# Patient Record
Sex: Female | Born: 1970 | Race: White | Hispanic: No | Marital: Single | Smoking: Current every day smoker
Health system: Southern US, Community
[De-identification: ages and names within clinical notes are randomized; demographics above are authoritative.]

## PROBLEM LIST (undated history)

## (undated) DIAGNOSIS — Z862 Personal history of diseases of the blood and blood-forming organs and certain disorders involving the immune mechanism: Secondary | ICD-10-CM

## (undated) HISTORY — PX: TUBAL LIGATION: SHX77

## (undated) HISTORY — DX: Personal history of diseases of the blood and blood-forming organs and certain disorders involving the immune mechanism: Z86.2

---

## 2013-01-12 DIAGNOSIS — Z862 Personal history of diseases of the blood and blood-forming organs and certain disorders involving the immune mechanism: Secondary | ICD-10-CM

## 2013-01-12 DIAGNOSIS — G4726 Circadian rhythm sleep disorder, shift work type: Secondary | ICD-10-CM | POA: Insufficient documentation

## 2013-01-12 HISTORY — DX: Personal history of diseases of the blood and blood-forming organs and certain disorders involving the immune mechanism: Z86.2

## 2013-03-13 DIAGNOSIS — Z9851 Tubal ligation status: Secondary | ICD-10-CM

## 2013-03-13 HISTORY — DX: Tubal ligation status: Z98.51

## 2013-03-14 DIAGNOSIS — M67439 Ganglion, unspecified wrist: Secondary | ICD-10-CM | POA: Insufficient documentation

## 2019-05-25 ENCOUNTER — Other Ambulatory Visit: Payer: Self-pay

## 2019-05-25 ENCOUNTER — Ambulatory Visit: Payer: BC Managed Care – PPO | Admitting: Medical-Surgical

## 2019-05-25 ENCOUNTER — Encounter: Payer: Self-pay | Admitting: Medical-Surgical

## 2019-05-25 VITALS — BP 138/83 | HR 74 | Temp 98.2°F | Ht 64.25 in | Wt 163.5 lb

## 2019-05-25 DIAGNOSIS — Z114 Encounter for screening for human immunodeficiency virus [HIV]: Secondary | ICD-10-CM

## 2019-05-25 DIAGNOSIS — Z0000 Encounter for general adult medical examination without abnormal findings: Secondary | ICD-10-CM

## 2019-05-25 DIAGNOSIS — Z23 Encounter for immunization: Secondary | ICD-10-CM

## 2019-05-25 DIAGNOSIS — Z7689 Persons encountering health services in other specified circumstances: Secondary | ICD-10-CM

## 2019-05-25 DIAGNOSIS — Z720 Tobacco use: Secondary | ICD-10-CM | POA: Insufficient documentation

## 2019-05-25 NOTE — Assessment & Plan Note (Signed)
Checking CBC, CMP, Lipids today. Tdap given today. Will return for pap smear.

## 2019-05-25 NOTE — Assessment & Plan Note (Addendum)
No current interest in smoking cessation. Risks/benefits and options available to help quit discussed.

## 2019-05-25 NOTE — Progress Notes (Signed)
New Patient Office Visit  Subjective:  Patient ID: Kelli Tate, female    DOB: 1970-07-19  Age: 49 y.o. MRN: [id number]  CC:  Chief Complaint  Patient presents with  . Establish Care    HPI Kelli Tate presents to establish care. No PCP in at least 10 years due to no insurance. Was seen once yearly at [facility] for lab work.   Tobacco abuse- smoking 1-1.5 packs of cigarettes per day for years. Usually quits during the winter as she smokes outside but did not quit this year. Not interested in cessation at this time.  IDA- had issues with iron deficiency in her teenage years and young adult hood. Has been good since the her son was born 12 years ago. No iron supplementation. No anemia s/s.  Sleep disturbance- works 3rd shift 4 days per week. 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 Using OTC Melatonin up to 30mg  per day to help with sleep.  Due for pap- uterine didelphys. Last pap 5 years ago, normal.  Past Medical History:  Diagnosis Date  . Hx of iron deficiency anemia     Past Surgical History:  Procedure Laterality Date  . TUBAL LIGATION      Family History  Problem Relation Age of Onset  . Asthma Mother   . Thyroid disease Mother   . Leukemia Sister   . Heart disease Sister   . Thyroid disease Maternal Uncle   . Asthma Maternal Grandmother   . Thyroid disease Maternal Grandmother     Social History   Socioeconomic History  . Marital status: Single    Spouse name: Not on file  . Number of children: Not on file  . Years of education: Not on file  . Highest education level: Not on file  Occupational History  . Not on file  Tobacco Use  . Smoking status: Current Every Day Smoker  . Smokeless tobacco: Never Used  Substance and Sexual Activity  . Alcohol use: Yes    Alcohol/week: 3.0 standard drinks    Types: 3 Shots of liquor per week  . Drug use: Never  . Sexual activity: Yes    Partners: Female    Birth control/protection:  Surgical  Other Topics Concern  . Not on file  Social History Narrative  . Not on file   Social Determinants of Health   Financial Resource Strain:   . Difficulty of Paying Living Expenses: Not on file  Food Insecurity:   . Worried About in the Last Year: Not on file  . Ran Out of Food in the Last Year: Not on file  Transportation Needs:   . Lack of Transportation (Medical): Not on file  . Lack of Transportation (Non-Medical): Not on file  Physical Activity:   . Days of Exercise per Week: Not on file  . Minutes of Exercise per Session: Not on file  Stress:   . Feeling of Stress : Not on file  Social Connections:   . Frequency of Communication with Friends and Family: Not on file  . Frequency of Social Gatherings with Friends and Family: Not on file  . Attends Religious Services: Not on file  . Active Member of Clubs or Organizations: Not on file  . Attends Programme researcher, broadcasting/film/video Meetings: Not on file  . Marital Status: Not on file  Intimate Partner Violence:   . Fear of Current or Ex-Partner: Not on file  . Emotionally Abused: Not  on file  . Physically Abused: Not on file  . Sexually Abused: Not on file    ROS Review of Systems  Constitutional: Negative for chills, fatigue, fever and unexpected weight change.  HENT: Negative for congestion, sinus pressure and sore throat.   Eyes: Negative for visual disturbance.  Respiratory: Negative for cough, chest tightness and shortness of breath.   Cardiovascular: Negative for chest pain, palpitations and leg swelling.  Gastrointestinal: Negative for abdominal pain, constipation, diarrhea, nausea and vomiting.  Endocrine: Negative for cold intolerance, heat intolerance, polydipsia, polyphagia and polyuria.  Genitourinary: Negative for dysuria, frequency and urgency.  Musculoskeletal: Negative for back pain and joint swelling.  Skin: Negative for rash.  Allergic/Immunologic: Positive for environmental allergies.   Neurological: Negative for dizziness, light-headedness and headaches.  Hematological: Negative for adenopathy. Does not bruise/bleed easily.  Psychiatric/Behavioral: Positive for sleep disturbance. Negative for self-injury and suicidal ideas.    Objective:   Today's Vitals: BP 138/83   Pulse 74   Temp 98.2 F (36.8 C) (Oral)   Ht 5' 4.25" (1.632 m)   Wt 163 lb 8 oz (74.2 kg)   LMP 05/17/2019   SpO2 99%   BMI 27.85 kg/m   Physical Exam Vitals reviewed.  Constitutional:      General: She is not in acute distress.    Appearance: Normal appearance.  HENT:     Head: Normocephalic and atraumatic.  Cardiovascular:     Rate and Rhythm: Normal rate and regular rhythm.     Pulses: Normal pulses.     Heart sounds: Normal heart sounds. No murmur. No friction rub. No gallop.   Pulmonary:     Effort: Pulmonary effort is normal. No respiratory distress.     Breath sounds: Normal breath sounds. No wheezing.  Skin:    General: Skin is warm and dry.     Coloration: Skin is not pale.  Neurological:     Mental Status: She is alert and oriented to person, place, and time.  Psychiatric:        Mood and Affect: Mood normal.        Behavior: Behavior normal.        Thought Content: Thought content normal.        Judgment: Judgment normal.     Assessment & Plan:   Tobacco abuse No current interest in smoking cessation. Risks/benefits and options available to help quit discussed.  Encounter for screening for HIV HIV antibody screening today.  Preventative health care Checking CBC, CMP, Lipids today. Tdap given today. Will return for pap smear.   Follow-up: Return in about 4 weeks (around 06/22/2019) for pap smear.   Clearnce Sorrel, DNP, APRN, FNP-BC [facility]

## 2019-05-25 NOTE — Assessment & Plan Note (Signed)
HIV antibody screening today.

## 2019-05-26 LAB — COMPLETE METABOLIC PANEL WITH GFR
AG Ratio: 1.7 (calc) (ref 1.0–2.5)
ALT: 12 U/L (ref 6–29)
AST: 13 U/L (ref 10–35)
Albumin: 4.4 g/dL (ref 3.6–5.1)
Alkaline phosphatase (APISO): 62 U/L (ref 31–125)
BUN: 9 mg/dL (ref 7–25)
CO2: 30 mmol/L (ref 20–32)
Calcium: 9.7 mg/dL (ref 8.6–10.2)
Chloride: 105 mmol/L (ref 98–110)
Creat: 0.77 mg/dL (ref 0.50–1.10)
GFR, Est African American: 106 mL/min/{1.73_m2} (ref >=60)
GFR, Est Non African American: 91 mL/min/{1.73_m2} (ref >=60)
Globulin: 2.6 g/dL (calc) (ref 1.9–3.7)
Glucose, Bld: 95 mg/dL (ref 65–99)
Potassium: 4.8 mmol/L (ref 3.5–5.3)
Sodium: 140 mmol/L (ref 135–146)
Total Bilirubin: 0.3 mg/dL (ref 0.2–1.2)
Total Protein: 7 g/dL (ref 6.1–8.1)

## 2019-05-26 LAB — LIPID PANEL
Cholesterol: 207 mg/dL — ABNORMAL HIGH (ref <200)
HDL: 59 mg/dL (ref >=50)
LDL Cholesterol (Calc): 123 mg/dL (calc) — ABNORMAL HIGH
Non-HDL Cholesterol (Calc): 148 mg/dL (calc) — ABNORMAL HIGH (ref <130)
Total CHOL/HDL Ratio: 3.5 (calc) (ref <5.0)
Triglycerides: 131 mg/dL (ref <150)

## 2019-05-26 LAB — CBC
HCT: 40.7 % (ref 35.0–45.0)
Hemoglobin: 13.8 g/dL (ref 11.7–15.5)
MCH: 30.9 pg (ref 27.0–33.0)
MCHC: 33.9 g/dL (ref 32.0–36.0)
MCV: 91.3 fL (ref 80.0–100.0)
MPV: 10.5 fL (ref 7.5–12.5)
Platelets: 245 10*3/uL (ref 140–400)
RBC: 4.46 10*6/uL (ref 3.80–5.10)
RDW: 14.2 % (ref 11.0–15.0)
WBC: 5.9 10*3/uL (ref 3.8–10.8)

## 2019-05-26 LAB — HIV ANTIBODY (ROUTINE TESTING W REFLEX): HIV 1&2 Ab, 4th Generation: NONREACTIVE

## 2019-06-21 ENCOUNTER — Ambulatory Visit: Payer: BC Managed Care – PPO | Admitting: Medical-Surgical

## 2019-06-29 ENCOUNTER — Other Ambulatory Visit (HOSPITAL_COMMUNITY)
Admission: RE | Admit: 2019-06-29 | Discharge: 2019-06-29 | Disposition: A | Discharge status: Home / Self Care | Payer: BC Managed Care – PPO | Source: Ambulatory Visit | Attending: Medical-Surgical | Admitting: Medical-Surgical

## 2019-06-29 ENCOUNTER — Encounter: Payer: Self-pay | Admitting: Medical-Surgical

## 2019-06-29 ENCOUNTER — Ambulatory Visit (INDEPENDENT_AMBULATORY_CARE_PROVIDER_SITE_OTHER): Payer: BC Managed Care – PPO | Admitting: Medical-Surgical

## 2019-06-29 VITALS — BP 119/62 | HR 84 | Ht 64.25 in | Wt 168.0 lb

## 2019-06-29 DIAGNOSIS — Z124 Encounter for screening for malignant neoplasm of cervix: Secondary | ICD-10-CM

## 2019-06-29 DIAGNOSIS — Q5128 Other doubling of uterus, other specified: Secondary | ICD-10-CM | POA: Diagnosis not present

## 2019-06-29 LAB — HM PAP SMEAR: HM Pap smear: NEGATIVE

## 2019-06-29 NOTE — Progress Notes (Signed)
Subjective:    CC: pap smear  HPI: Pleasant 49 year old female presenting for pap smear. History of uterine didelphys. Last pap- 5 years ago, normal. No history of abnormal pap results.  No vaginal discharge, odor, or abnormal bleeding. Menstrual cycles regular, heavy, accompanied by cramping and passing of clots. Uses Ibuprofen and Tylenol as needed.   No concerns today.  I reviewed the past medical history, family history, social history, surgical history, and allergies today and no changes were needed.  Please see the problem list section below in epic for further details.  Past Medical History: Past Medical History:  Diagnosis Date  . Hx of iron deficiency anemia    Past Surgical History: Past Surgical History:  Procedure Laterality Date  . TUBAL LIGATION     Social History: Social History   Socioeconomic History  . Marital status: Single    Spouse name: Not on file  . Number of children: Not on file  . Years of education: Not on file  . Highest education level: Not on file  Occupational History  . Not on file  Tobacco Use  . Smoking status: Current Every Day Smoker  . Smokeless tobacco: Never Used  Substance and Sexual Activity  . Alcohol use: Yes    Alcohol/week: 3.0 standard drinks    Types: 3 Shots of liquor per week  . Drug use: Never  . Sexual activity: Yes    Partners: Female    Birth control/protection: Surgical  Other Topics Concern  . Not on file  Social History Narrative  . Not on file   Social Determinants of Health   Financial Resource Strain:   . Difficulty of Paying Living Expenses:   Food Insecurity:   . Worried About Charity fundraiser in the Last Year:   . Arboriculturist in the Last Year:   Transportation Needs:   . Film/video editor (Medical):   Marland Kitchen Lack of Transportation (Non-Medical):   Physical Activity:   . Days of Exercise per Week:   . Minutes of Exercise per Session:   Stress:   . Feeling of Stress :   Social  Connections:   . Frequency of Communication with Friends and Family:   . Frequency of Social Gatherings with Friends and Family:   . Attends Religious Services:   . Active Member of Clubs or Organizations:   . Attends Archivist Meetings:   Marland Kitchen Marital Status:    Family History: Family History  Problem Relation Age of Onset  . Asthma Mother   . Thyroid disease Mother   . Leukemia Sister   . Heart disease Sister   . Thyroid disease Maternal Uncle   . Asthma Maternal Grandmother   . Thyroid disease Maternal Grandmother    Allergies: No Known Allergies Medications: See med rec.  Review of Systems: No fevers, chills, night sweats, weight loss, chest pain, or shortness of breath.   Objective:    General: Well Developed, well nourished, and in no acute distress.  Neuro: Alert and oriented x3.  HEENT: Normocephalic, atraumatic.  Skin: Warm and dry. Small mobile, firm nodule on upper abdomen with dark spot in the center resembling a blackhead. Cardiac: Regular rate and rhythm, no murmurs rubs or gallops, no lower extremity edema.  Respiratory: Clear to auscultation bilaterally. Not using accessory muscles, speaking in full sentences. Breasts: right breast normal without mass, skin or nipple changes or axillary nodes, left breast normal without mass, skin or nipple changes or axillary  nodes, abnormal mass palpable center of breasts along the lower edge of sternum, hard firm nodule present for years per patient.  Pelvic exam: VULVA: normal appearing vulva with no masses, tenderness or lesions, VAGINA: normal appearing vagina with normal color and discharge, no lesions, small vaginal septum near cervices, CERVIX: both cervix normal appearing without discharge or lesions, UTERUS: both uteri normal size, shape, consistency and nontender, ADNEXA: normal adnexa in size, nontender and no masses, PAP: Pap smear done x 2 today.   Chaperoned by Donne Anon, MA.    Impression and  Recommendations:    1. Cervical cancer screening Pap smear completed on each cervix today with HPV co-testing. If negative, next pap due in 5 years. - Cytology - PAP - Cytology - PAP  Return in about 1 year (around 06/28/2020) for annual physical exam with labs. ___________________________________________ Thayer Ohm, DNP, APRN, FNP-BC [facility] [place]

## 2019-07-02 LAB — CYTOLOGY - PAP
Comment: NEGATIVE
Comment: NEGATIVE
Diagnosis: NEGATIVE
Diagnosis: NEGATIVE
High risk HPV: NEGATIVE
High risk HPV: NEGATIVE

## 2019-07-03 ENCOUNTER — Encounter: Payer: Self-pay | Admitting: Medical-Surgical

## 2020-04-03 ENCOUNTER — Telehealth (INDEPENDENT_AMBULATORY_CARE_PROVIDER_SITE_OTHER): Payer: BC Managed Care – PPO | Admitting: Nurse Practitioner

## 2020-04-03 ENCOUNTER — Ambulatory Visit (INDEPENDENT_AMBULATORY_CARE_PROVIDER_SITE_OTHER): Payer: BC Managed Care – PPO | Admitting: Nurse Practitioner

## 2020-04-03 ENCOUNTER — Encounter: Payer: Self-pay | Admitting: Nurse Practitioner

## 2020-04-03 DIAGNOSIS — J069 Acute upper respiratory infection, unspecified: Secondary | ICD-10-CM

## 2020-04-03 DIAGNOSIS — R059 Cough, unspecified: Secondary | ICD-10-CM

## 2020-04-03 LAB — POCT INFLUENZA A/B
Influenza A, POC: NEGATIVE
Influenza B, POC: NEGATIVE

## 2020-04-03 NOTE — Progress Notes (Signed)
I spoke to patient on phone this afternoon- no need to call.   Flu testing was negative. Recommended monitoring of symptoms and to follow-up if they worsen or fail to improve.

## 2020-04-03 NOTE — Progress Notes (Signed)
Pt came by to have flu testing done.   She will be advised of results today   I have reviewed the assessment and plan listed above and agree with the treatment provided.  Tollie Eth, DNP, AGNP-c

## 2020-04-03 NOTE — Patient Instructions (Addendum)
We have many viral illnesses going around in our community right now. Viruses do not respond to antibiotics and most people recover in about a week. I have included information below on medications that can be helpful for your symptoms.   Viruses spread through the air with talking, coughing, sneezing, and laughing. Depending on the specific virus they can be very contagious to others around you. We recommend that while you are feeling ill you stay at home, away from others, to prevent the spread of infection and to protect yourself from other infections while your immune system is down.   If you must go out or be around others, we strongly recommend that you wear a mask, wash your hands frequently, and avoid touching your face.    Someone will let you know your flu test results. If it is positive, then we will plan to start Tamiflu for you. If it is negative, I want you to monitor your symptoms and if they do not improve or get worse after about 5 days, then please let us know.    The following information is provided as a Counsellor for ADULT patients only and does NOT take into account PREGNANCY, ALLERGIES, LIVER CONDITIONS, KIDNEY CONDITIONS, GASTROINTESTINAL CONDITIONS, OR PRESCRIPTION MEDICATION INTERACTIONS. Please be sure to ask your provider if the following are safe to take with your specific medical history, conditions, or current medication regimen if you are unsure.   Adult Basic Symptom Management for Sinusitis  Congestion: Guaifenesin (Mucinex)- follow directions on packaging with a maximum dose of 2400mg  in a 24 hour period.  Pain/Fever: Ibuprofen 200mg  - 400mg  every 4-6 hours as needed (MAX 1200mg  in a 24 hour period) Pain/Fever: Tylenol 500mg  -1000mg  every 6-8 hours as needed (MAX 3000mg  in a 24 hour period)  Cough: Dextromethorphan (Delsym)- follow directions on packing with a maximum dose of 120mg  in a 24 hour period.  Nasal Stuffiness: Saline nasal spray and/or Nettie  Pot with sterile saline solution  Runny Nose: Fluticasone nasal spray (Flonase) OR Mometasone nasal spray (Nasonex) OR Triamcinolone Acetonide nasal spray (Nasacort)- follow directions on the packaging  Pain/Pressure: Warm washcloth to the face  Sore Throat: Warm salt water gargles  If you have allergies, you may also consider taking an oral antihistamine (like Zyrtec or Claritin) as these may also help with your symptoms.  **Many medications will have more than one ingredient, be sure you are reading the packaging carefully and not taking more than one dose of the same kind of medication at the same time or too close together. It is OK to use formulas that have all of the ingredients you want, but do not take them in a combined medication and as separate dose too close together. If you have any questions, the pharmacist will be happy to help you decide what is safe.    Influenza, Adult Influenza is also called "the flu." It is an infection in the lungs, nose, and throat (respiratory tract). It is caused by a virus. The flu causes symptoms that are similar to symptoms of a cold. It also causes a high fever and body aches. The flu spreads easily from person to person (is contagious). Getting a flu shot (influenza vaccination) every year is the best way to prevent the flu. What are the causes? This condition is caused by the influenza virus. You can get the virus by:  Breathing in droplets that are in the air from the cough or sneeze of a person who has the  virus.  Touching something that has the virus on it (is contaminated) and then touching your mouth, nose, or eyes. What increases the risk? Certain things may make you more likely to get the flu. These include:  Not washing your hands often.  Having close contact with many people during cold and flu season.  Touching your mouth, eyes, or nose without first washing your hands.  Not getting a flu shot every year. You may have a higher  risk for the flu, along with serious problems such as a lung infection (pneumonia), if you:  Are older than 65.  Are pregnant.  Have a weakened disease-fighting system (immune system) because of a disease or taking certain medicines.  Have a long-term (chronic) illness, such as: ? Heart, kidney, or lung disease. ? Diabetes. ? Asthma.  Have a liver disorder.  Are very overweight (morbidly obese).  Have anemia. This is a condition that affects your red blood cells. What are the signs or symptoms? Symptoms usually begin suddenly and last 4-14 days. They may include:  Fever and chills.  Headaches, body aches, or muscle aches.  Sore throat.  Cough.  Runny or stuffy (congested) nose.  Chest discomfort.  Not wanting to eat as much as normal (poor appetite).  Weakness or feeling tired (fatigue).  Dizziness.  Feeling sick to your stomach (nauseous) or throwing up (vomiting). How is this treated? If the flu is found Kelli Tate, you can be treated with medicine that can help reduce how bad the illness is and how long it lasts (antiviral medicine). This may be given by mouth (orally) or through an IV tube. Taking care of yourself at home can help your symptoms get better. Your doctor may suggest:  Taking over-the-counter medicines.  Drinking plenty of fluids. The flu often goes away on its own. If you have very bad symptoms or other problems, you may be treated in a hospital. Follow these instructions at home:     Activity  Rest as needed. Get plenty of sleep.  Stay home from work or school as told by your doctor. ? Do not leave home until you do not have a fever for 24 hours without taking medicine. ? Leave home only to visit your doctor. Eating and drinking  Take an ORS (oral rehydration solution). This is a drink that is sold at pharmacies and stores.  Drink enough fluid to keep your pee (urine) pale yellow.  Drink clear fluids in small amounts as you are able.  Clear fluids include: ? Water. ? Ice chips. ? Fruit juice that has water added (diluted fruit juice). ? Low-calorie sports drinks.  Eat bland, easy-to-digest foods in small amounts as you are able. These foods include: ? Bananas. ? Applesauce. ? Rice. ? Lean meats. ? Toast. ? Crackers.  Do not eat or drink: ? Fluids that have a lot of sugar or caffeine. ? Alcohol. ? Spicy or fatty foods. General instructions  Take over-the-counter and prescription medicines only as told by your doctor.  Use a cool mist humidifier to add moisture to the air in your home. This can make it easier for you to breathe.  Cover your mouth and nose when you cough or sneeze.  Wash your hands with soap and water often, especially after you cough or sneeze. If you cannot use soap and water, use alcohol-based hand sanitizer.  Keep all follow-up visits as told by your doctor. This is important. How is this prevented?   Get a flu shot every  year. Kelli Tate may get the flu shot in late summer, fall, or winter. Ask your doctor when you should get your flu shot.  Avoid contact with people who are sick during fall and winter (cold and flu season). Contact a doctor if:  You get new symptoms.  You have: ? Chest pain. ? Watery poop (diarrhea). ? A fever.  Your cough gets worse.  You start to have more mucus.  You feel sick to your stomach.  You throw up. Get help right away if you:  Have shortness of breath.  Have trouble breathing.  Have skin or nails that turn a bluish color.  Have very bad pain or stiffness in your neck.  Get a sudden headache.  Get sudden pain in your face or ear.  Cannot eat or drink without throwing up. Summary  Influenza ("the flu") is an infection in the lungs, nose, and throat. It is caused by a virus.  Take over-the-counter and prescription medicines only as told by your doctor.  Getting a flu shot every year is the best way to avoid getting the flu. This  information is not intended to replace advice given to you by your health care provider. Make sure you discuss any questions you have with your health care provider. Document Revised: 09/14/2017 Document Reviewed: 09/14/2017 Elsevier Patient Education  2020 [employer]   Sinusitis, Adult Sinusitis is soreness and swelling (inflammation) of your sinuses. Sinuses are hollow spaces in the bones around your face. They are located:  Around your eyes.  In the middle of your forehead.  Behind your nose.  In your cheekbones. Your sinuses and nasal passages are lined with a fluid called mucus. Mucus drains out of your sinuses. Swelling can trap mucus in your sinuses. This lets germs (bacteria, virus, or fungus) grow, which leads to infection. Most of the time, this condition is caused by a virus. What are the causes? This condition is caused by:  Allergies.  Asthma.  Germs.  Things that block your nose or sinuses.  Growths in the nose (nasal polyps).  Chemicals or irritants in the air.  Fungus (rare). What increases the risk? You are more likely to develop this condition if:  You have a weak body defense system (immune system).  You do a lot of swimming or diving.  You use nasal sprays too much.  You smoke. What are the signs or symptoms? The main symptoms of this condition are pain and a feeling of pressure around the sinuses. Other symptoms include:  Stuffy nose (congestion).  Runny nose (drainage).  Swelling and warmth in the sinuses.  Headache.  Toothache.  A cough that may get worse at night.  Mucus that collects in the throat or the back of the nose (postnasal drip).  Being unable to smell and taste.  Being very tired (fatigue).  A fever.  Sore throat.  Bad breath. How is this diagnosed? This condition is diagnosed based on:  Your symptoms.  Your medical history.  A physical exam.  Tests to find out if your condition is short-term (acute)  or long-term (chronic). Your doctor may: ? Check your nose for growths (polyps). ? Check your sinuses using a tool that has a light (endoscope). ? Check for allergies or germs. ? Do imaging tests, such as an MRI or CT scan. How is this treated? Treatment for this condition depends on the cause and whether it is short-term or long-term.  If caused by a virus, your symptoms should go  away on their own within 10 days. You may be given medicines to relieve symptoms. They include: ? Medicines that shrink swollen tissue in the nose. ? Medicines that treat allergies (antihistamines). ? A spray that treats swelling of the nostrils. ? Rinses that help get rid of thick mucus in your nose (nasal saline washes).  If caused by bacteria, your doctor may wait to see if you will get better without treatment. You may be given antibiotic medicine if you have: ? A very bad infection. ? A weak body defense system.  If caused by growths in the nose, you may need to have surgery. Follow these instructions at home: Medicines  Take, use, or apply over-the-counter and prescription medicines only as told by your doctor. These may include nasal sprays.  If you were prescribed an antibiotic medicine, take it as told by your doctor. Do not stop taking the antibiotic even if you start to feel better. Hydrate and humidify   Drink enough water to keep your pee (urine) pale yellow.  Use a cool mist humidifier to keep the humidity level in your home above 50%.  Breathe in steam for 10-15 minutes, 3-4 times a day, or as told by your doctor. You can do this in the bathroom while a hot shower is running.  Try not to spend time in cool or dry air. Rest  Rest as much as you can.  Sleep with your head raised (elevated).  Make sure you get enough sleep each night. General instructions   Put a warm, moist washcloth on your face 3-4 times a day, or as often as told by your doctor. This will help with  discomfort.  Wash your hands often with soap and water. If there is no soap and water, use hand sanitizer.  Do not smoke. Avoid being around people who are smoking (secondhand smoke).  Keep all follow-up visits as told by your doctor. This is important. Contact a doctor if:  You have a fever.  Your symptoms get worse.  Your symptoms do not get better within 10 days. Get help right away if:  You have a very bad headache.  You cannot stop throwing up (vomiting).  You have very bad pain or swelling around your face or eyes.  You have trouble seeing.  You feel confused.  Your neck is stiff.  You have trouble breathing. Summary  Sinusitis is swelling of your sinuses. Sinuses are hollow spaces in the bones around your face.  This condition is caused by tissues in your nose that become inflamed or swollen. This traps germs. These can lead to infection.  If you were prescribed an antibiotic medicine, take it as told by your doctor. Do not stop taking it even if you start to feel better.  Keep all follow-up visits as told by your doctor. This is important. This information is not intended to replace advice given to you by your health care provider. Make sure you discuss any questions you have with your health care provider. Document Revised: 08/29/2017 Document Reviewed: 08/29/2017 Elsevier Patient Education  2020 [employer].

## 2020-04-03 NOTE — Progress Notes (Signed)
Virtual Video Visit via MyChart Note  I connected with  Kelli Tate on 04/03/20 at 10:50 AM EST by the video enabled telemedicine application for , MyChart, and verified that I am speaking with the correct person using two identifiers.   I introduced myself as a Publishing rights manager with the practice. We discussed the limitations of evaluation and management by telemedicine and the availability of in person appointments. The patient expressed understanding and agreed to proceed.  Participating parties in this visit include: the patient and the nurse practitioner listed The patient is: at home I am: in the office  Subjective:    CC:  Chief Complaint  Patient presents with  . URI   HPI: Kelli Tate is a 49 y.o. y/o female presenting via MyChart today for upper respiratory symptoms. She has not had a flu vaccine this year. She has had her COVID vaccine.    Woke up yesterday with a sore throat, suspected fever last night, chills, generalized body aches, sinus pain and pressure, yellow mucous production, cough, and headache.  She denies loss of taste, loss of smell, shortness of breath, nausea, vomiting, or diarrhea.   She did take a home COVID test after her symptoms started and that was negative. She was around her aunt last week who had a sinus infection.   She has been taking alka seltzer plus cold and humidifier  Past medical history, Surgical history, Family history not pertinant except as noted below, Social history, Allergies, and medications have been entered into the medical record, reviewed, and corrections made.   Review of Systems:  All review of systems negative except what is listed in the HPI   Objective:    General: Speaking clearly in complete sentences without any shortness of breath.   Mild audible congestion present.  Alert and oriented x3.   Normal judgment.  No apparent acute distress.  Impression and Recommendations:    1. Viral URI with cough Symptoms  and presentation consistent with viral upper respiratory infection with sinus involvement.  Discussed with patient that antibiotic treatment is not effective for viral infections.  Recommendations for OTC treatment options provided including rest, hydration, and avoidance of other people.  Given that home test for COVID was negative after symptom onset, this is unlikely at this time. I do feel she would benefit from a flu test and if positive we can start Tamiflu.  Discussed ongoing monitoring of symptoms and recommendation for follow-up if her symptoms improve and then worsen again or if she continues to have symptoms after 5 days of illness- at that time, antibiotics would be considered for suspected bacterial involvement.     I discussed the assessment and treatment plan with the patient. The patient was provided an opportunity to ask questions and all were answered. The patient agreed with the plan and demonstrated an understanding of the instructions.   The patient was advised to call back or seek an in-person evaluation if the symptoms worsen or if the condition fails to improve as anticipated.  I provided 20 minutes of non-face-to-face interaction with this MYCHART visit including intake, same-day documentation, and chart review.   Tollie Eth, NP

## 2020-04-03 NOTE — Addendum Note (Signed)
Addended by: Deno Etienne on: 04/03/2020 02:39 PM   Modules accepted: Orders

## 2020-06-12 ENCOUNTER — Other Ambulatory Visit: Payer: Self-pay | Admitting: Family Medicine

## 2020-06-12 DIAGNOSIS — M545 Low back pain, unspecified: Secondary | ICD-10-CM

## 2020-07-09 ENCOUNTER — Other Ambulatory Visit: Payer: Self-pay

## 2020-07-09 ENCOUNTER — Ambulatory Visit
Admission: RE | Admit: 2020-07-09 | Discharge: 2020-07-09 | Disposition: A | Discharge status: Home / Self Care | Payer: BC Managed Care – PPO | Source: Ambulatory Visit | Attending: Family Medicine | Admitting: Family Medicine

## 2020-07-09 DIAGNOSIS — M545 Low back pain, unspecified: Secondary | ICD-10-CM

## 2020-09-10 ENCOUNTER — Ambulatory Visit: Payer: BC Managed Care – PPO | Admitting: Medical-Surgical

## 2020-09-10 ENCOUNTER — Encounter: Payer: Self-pay | Admitting: Medical-Surgical

## 2020-09-10 ENCOUNTER — Other Ambulatory Visit: Payer: Self-pay

## 2020-09-10 ENCOUNTER — Ambulatory Visit (INDEPENDENT_AMBULATORY_CARE_PROVIDER_SITE_OTHER): Payer: BC Managed Care – PPO | Admitting: Sports Medicine

## 2020-09-10 VITALS — BP 129/82 | HR 76 | Temp 98.1°F | Resp 20 | Ht 64.25 in | Wt 175.5 lb

## 2020-09-10 DIAGNOSIS — Z720 Tobacco use: Secondary | ICD-10-CM

## 2020-09-10 DIAGNOSIS — G8929 Other chronic pain: Secondary | ICD-10-CM

## 2020-09-10 DIAGNOSIS — M545 Low back pain, unspecified: Secondary | ICD-10-CM | POA: Diagnosis not present

## 2020-09-10 DIAGNOSIS — M48061 Spinal stenosis, lumbar region without neurogenic claudication: Secondary | ICD-10-CM | POA: Diagnosis not present

## 2020-09-10 DIAGNOSIS — R944 Abnormal results of kidney function studies: Secondary | ICD-10-CM | POA: Diagnosis not present

## 2020-09-10 MED ORDER — TIZANIDINE HCL 6 MG PO CAPS: 6.0000 mg | ORAL_CAPSULE | Freq: Three times a day (TID) | ORAL | 3 refills | Status: DC | PRN

## 2020-09-10 MED ORDER — BUPROPION HCL ER (SR) 150 MG PO TB12
150.0000 mg | ORAL_TABLET | Freq: Two times a day (BID) | ORAL | 1 refills | Status: DC
Start: 2020-09-10 — End: 2021-03-12

## 2020-09-10 NOTE — Progress Notes (Signed)
Subjective:    CC: discuss concerns  HPI: Pleasant 50 year old female presenting today to discuss some recent concerns.  She has been seeking care at [facility] for low back pain.  They are prescribing several medications to help manage her pain but she feels like their treatment is more of a Band-Aid rather than a fix.  She would prefer not to be on medications long-term bothersome.  She does have an appointment with Dr. Karie Schwalbe to discuss potential options for treatment today.  Her biggest concern today is that [facility] had lab work drawn and diagnosed her with stage III CKD.  As of her last check March 2021, her GFR was 91.  On her results from a few months ago, her GFR had reduced to 57.  She is not using regular NSAIDs and has not had any issues with dehydration.  She is very concerned that her kidney function is reduced so much and would like to have it rechecked today.  She said no changes in urinary function.  Currently taking Wellbutrin 150 mg twice daily to help with smoking cessation.  Notes that she was smoking around 2 packs/day but she has been able to reduce this to a little less than 1 pack/day.  She has tried patches in the past and been able to quit successfully but with her work, she frequently sweats the patch of.  She has tried holding in place with Band-Aids but this was unsuccessful.  I reviewed the past medical history, family history, social history, surgical history, and allergies today and no changes were needed.  Please see the problem list section below in epic for further details.  Past Medical History: Past Medical History:  Diagnosis Date  . Hx of iron deficiency anemia    Past Surgical History: Past Surgical History:  Procedure Laterality Date  . TUBAL LIGATION     Social History: Social History   Socioeconomic History  . Marital status: Single    Spouse name: Not on file  . Number of children: Not on file  . Years of education: Not on file  .  Highest education level: Not on file  Occupational History  . Not on file  Tobacco Use  . Smoking status: Current Every Day Smoker  . Smokeless tobacco: Never Used  Substance and Sexual Activity  . Alcohol use: Yes    Alcohol/week: 3.0 standard drinks    Types: 3 Shots of liquor per week  . Drug use: Never  . Sexual activity: Yes    Partners: Female    Birth control/protection: Surgical  Other Topics Concern  . Not on file  Social History Narrative  . Not on file   Social Determinants of Health   Financial Resource Strain: Not on file  Food Insecurity: Not on file  Transportation Needs: Not on file  Physical Activity: Not on file  Stress: Not on file  Social Connections: Not on file   Family History: Family History  Problem Relation Age of Onset  . Asthma Mother   . Thyroid disease Mother   . Leukemia Sister   . Heart disease Sister   . Thyroid disease Maternal Uncle   . Asthma Maternal Grandmother   . Thyroid disease Maternal Grandmother    Allergies: No Known Allergies Medications: See med rec.  Review of Systems: See HPI for pertinent positives and negatives.   Objective:    General: Well Developed, well nourished, and in no acute distress.  Neuro: Alert and oriented x3.  HEENT: Normocephalic, atraumatic.  Skin: Warm and dry. Cardiac: Regular rate and rhythm, no murmurs rubs or gallops, no lower extremity edema.  Respiratory: Clear to auscultation bilaterally. Not using accessory muscles, speaking in full sentences.  Impression and Recommendations:    1. Decreased GFR Discussed renal function and indicators of CKD stage III.  We will recheck her CMP today to see if this was a transient reduction.  Reviewed medications to avoid as well as hydration recommendations. - COMPLETE METABOLIC PANEL WITH GFR  2. Tobacco abuse Continue Wellbutrin 150 mg twice daily.  If unable to keep the patch in place with Band-Aids, consider Tegaderm. - buPROPion (WELLBUTRIN  SR) 150 MG 12 hr tablet; Take 1 tablet (150 mg total) by mouth 2 (two) times daily.  Dispense: 180 tablet; Refill: 1  3. Chronic bilateral low back pain without sciatica Continue with plan to see Dr. Karie Schwalbe.  Return in about 6 months (around 03/12/2021) for chronic condition follow up. ___________________________________________ Thayer Ohm, DNP, APRN, FNP-BC Primary Care and Sports Medicine [facility] [place]

## 2020-09-10 NOTE — Progress Notes (Signed)
    Procedures performed today:    None.  Independent interpretation of notes and tests performed by another provider:   MRI personally reviewed, dominant finding is mild spondylolisthesis and central canal stenosis at L4-L5.  Brief History, Exam, Impression, and Recommendations:    Lumbar spinal stenosis This is a pleasant 50 year old female, she has had 6 months of low back pain, axial with occasional radiation around the right anterior thigh. She has done formal physical therapy without much improvement, she has had medications including tramadol and tizanidine, tramadol not effective, tizanidine does appear to give some relief, I am going to increase her dose. She cannot use NSAIDs due to chronic renal insufficiency. Considering failure of the above conservative treatment we will proceed with a right L4-L5 interlaminar epidural. Return to see me 1 month after the injection.    ___________________________________________ Ihor Austin. Benjamin Stain, M.D., ABFM., CAQSM. Primary Care and Sports Medicine [facility]  Adjunct Instructor of Family Medicine  [employer]

## 2020-09-10 NOTE — Assessment & Plan Note (Signed)
This is a pleasant 50 year old female, she has had 6 months of low back pain, axial with occasional radiation around the right anterior thigh. She has done formal physical therapy without much improvement, she has had medications including tramadol and tizanidine, tramadol not effective, tizanidine does appear to give some relief, I am going to increase her dose. She cannot use NSAIDs due to chronic renal insufficiency. Considering failure of the above conservative treatment we will proceed with a right L4-L5 interlaminar epidural. Return to see me 1 month after the injection.

## 2020-09-11 LAB — COMPLETE METABOLIC PANEL WITHOUT GFR
AG Ratio: 2.2 (calc) (ref 1.0–2.5)
ALT: 10 U/L (ref 6–29)
AST: 14 U/L (ref 10–35)
Albumin: 4.6 g/dL (ref 3.6–5.1)
Alkaline phosphatase (APISO): 58 U/L (ref 31–125)
BUN: 8 mg/dL (ref 7–25)
CO2: 26 mmol/L (ref 20–32)
Calcium: 10.1 mg/dL (ref 8.6–10.2)
Chloride: 104 mmol/L (ref 98–110)
Creat: 0.89 mg/dL (ref 0.50–1.10)
GFR, Est African American: 88 mL/min/{1.73_m2}
GFR, Est Non African American: 76 mL/min/{1.73_m2}
Globulin: 2.1 g/dL (ref 1.9–3.7)
Glucose, Bld: 120 mg/dL — ABNORMAL HIGH (ref 65–99)
Potassium: 4.2 mmol/L (ref 3.5–5.3)
Sodium: 139 mmol/L (ref 135–146)
Total Bilirubin: 0.3 mg/dL (ref 0.2–1.2)
Total Protein: 6.7 g/dL (ref 6.1–8.1)

## 2020-09-17 ENCOUNTER — Ambulatory Visit
Admission: RE | Admit: 2020-09-17 | Discharge: 2020-09-17 | Disposition: A | Discharge status: Home / Self Care | Payer: BC Managed Care – PPO | Source: Ambulatory Visit | Attending: Sports Medicine | Admitting: Sports Medicine

## 2020-09-17 DIAGNOSIS — M48061 Spinal stenosis, lumbar region without neurogenic claudication: Secondary | ICD-10-CM

## 2020-09-17 MED ORDER — IOPAMIDOL (ISOVUE-M 200) INJECTION 41%
1.0000 mL | Freq: Once | INTRAMUSCULAR | Status: AC
  Administered 2020-09-17: 1 mL via EPIDURAL

## 2020-09-17 MED ORDER — METHYLPREDNISOLONE ACETATE 40 MG/ML INJ SUSP (RADIOLOG
80.0000 mg | Freq: Once | INTRAMUSCULAR | Status: AC
  Administered 2020-09-17: 80 mg via EPIDURAL

## 2020-09-17 NOTE — Discharge Instructions (Signed)
Post Procedure Spinal Discharge Instruction Sheet  1. You may resume a regular diet and any medications that you routinely take (including pain medications).  2. No driving day of procedure.  3. Light activity throughout the rest of the day.  Do not do any strenuous work, exercise, bending or lifting.  The day following the procedure, you can resume normal physical activity but you should refrain from exercising or physical therapy for at least three days thereafter.   Common Side Effects:   Headaches- take your usual medications as directed by your physician.  Increase your fluid intake.  Caffeinated beverages may be helpful.  Lie flat in bed until your headache resolves.   Restlessness or inability to sleep- you may have trouble sleeping for the next few days.  Ask your referring physician if you need any medication for sleep.   Facial flushing or redness- should subside within a few days.   Increased pain- a temporary increase in pain a day or two following your procedure is not unusual.  Take your pain medication as prescribed by your referring physician.   Leg cramps  Please contact our office at [phone] for the following symptoms:  Fever greater than 100 degrees.  Headaches unresolved with medication after 2-3 days.  Increased swelling, pain, or redness at injection site.   Thank you for visiting [facility] today.  

## 2021-01-29 ENCOUNTER — Ambulatory Visit: Payer: BC Managed Care – PPO | Admitting: Sports Medicine

## 2021-01-29 ENCOUNTER — Ambulatory Visit (INDEPENDENT_AMBULATORY_CARE_PROVIDER_SITE_OTHER): Payer: BC Managed Care – PPO

## 2021-01-29 ENCOUNTER — Other Ambulatory Visit: Payer: Self-pay

## 2021-01-29 DIAGNOSIS — M25552 Pain in left hip: Secondary | ICD-10-CM | POA: Diagnosis not present

## 2021-01-29 DIAGNOSIS — M48061 Spinal stenosis, lumbar region without neurogenic claudication: Secondary | ICD-10-CM

## 2021-01-29 NOTE — Assessment & Plan Note (Signed)
Kelli Tate returns, she is a pleasant 49 year old female, she has several months of low back pain, we ultimately diagnosed lumbar spinal stenosis and treated her with a right L4-L5 interlaminar epidural that provided complete relief. She is unable to use NSAIDs due to chronic renal insufficiency. Historically tramadol, tizanidine were not effective. Considering her complete relief, no further intervention or evaluation of her low back as needed.

## 2021-01-29 NOTE — Assessment & Plan Note (Addendum)
She also had an episode where she had some difficulty lifting up her left thigh, there was some moderate pain, her friend manipulated her leg and there was a pop and she was able to move it again. No injuries. She shows the C sign when describing her discomfort. On exam she has a positive FADIR sign consistent with a hip labral tear. We will start conservatively, left hip x-rays, hip conditioning exercises. Return to see me in 6 weeks as needed for MR arthrography.

## 2021-01-29 NOTE — Progress Notes (Signed)
    Procedures performed today:    None.  Independent interpretation of notes and tests performed by another provider:   None.  Brief History, Exam, Impression, and Recommendations:    Lumbar spinal stenosis Kelli Tate returns, she is a pleasant 50 year old female, she has several months of low back pain, we ultimately diagnosed lumbar spinal stenosis and treated her with a right L4-L5 interlaminar epidural that provided complete relief. She is unable to use NSAIDs due to chronic renal insufficiency. Historically tramadol, tizanidine were not effective. Considering her complete relief, no further intervention or evaluation of her low back as needed.  Left hip pain She also had an episode where she had some difficulty lifting up her left thigh, there was some moderate pain, her friend manipulated her leg and there was a pop and she was able to move it again. No injuries. She shows the C sign when describing her discomfort. On exam she has a positive FADIR sign consistent with a hip labral tear. We will start conservatively, left hip x-rays, hip conditioning exercises. Return to see me in 6 weeks as needed for MR arthrography.    ___________________________________________ Ihor Austin. Benjamin Stain, M.D., ABFM., CAQSM. Primary Care and Sports Medicine [facility]  Adjunct Instructor of Family Medicine  [employer]

## 2021-02-25 ENCOUNTER — Other Ambulatory Visit: Payer: Self-pay | Admitting: Sports Medicine

## 2021-02-25 DIAGNOSIS — M48061 Spinal stenosis, lumbar region without neurogenic claudication: Secondary | ICD-10-CM

## 2021-03-12 ENCOUNTER — Other Ambulatory Visit: Payer: Self-pay

## 2021-03-12 ENCOUNTER — Ambulatory Visit: Payer: BC Managed Care – PPO | Admitting: Medical-Surgical

## 2021-03-12 ENCOUNTER — Encounter: Payer: Self-pay | Admitting: Medical-Surgical

## 2021-03-12 ENCOUNTER — Ambulatory Visit: Payer: BC Managed Care – PPO | Admitting: Sports Medicine

## 2021-03-12 VITALS — BP 133/76 | HR 65 | Resp 20 | Ht 64.25 in | Wt 172.0 lb

## 2021-03-12 DIAGNOSIS — Z1231 Encounter for screening mammogram for malignant neoplasm of breast: Secondary | ICD-10-CM

## 2021-03-12 DIAGNOSIS — M48061 Spinal stenosis, lumbar region without neurogenic claudication: Secondary | ICD-10-CM

## 2021-03-12 DIAGNOSIS — Z720 Tobacco use: Secondary | ICD-10-CM | POA: Diagnosis not present

## 2021-03-12 DIAGNOSIS — Z23 Encounter for immunization: Secondary | ICD-10-CM

## 2021-03-12 DIAGNOSIS — F4329 Adjustment disorder with other symptoms: Secondary | ICD-10-CM

## 2021-03-12 DIAGNOSIS — M25552 Pain in left hip: Secondary | ICD-10-CM | POA: Diagnosis not present

## 2021-03-12 MED ORDER — BUPROPION HCL ER (SR) 150 MG PO TB12
150.0000 mg | ORAL_TABLET | Freq: Two times a day (BID) | ORAL | 3 refills | Status: DC
Start: 2021-03-12 — End: 2023-08-17

## 2021-03-12 MED ORDER — TIZANIDINE HCL 6 MG PO CAPS: 6.0000 mg | ORAL_CAPSULE | Freq: Two times a day (BID) | ORAL | 3 refills | Status: DC | PRN

## 2021-03-12 MED ORDER — ESCITALOPRAM OXALATE 5 MG PO TABS: 5.0000 mg | ORAL_TABLET | Freq: Every day | ORAL | 3 refills | Status: DC

## 2021-03-12 MED ORDER — TRAMADOL HCL 50 MG PO TABS: 50.0000 mg | ORAL_TABLET | Freq: Three times a day (TID) | ORAL | 0 refills | Status: DC | PRN

## 2021-03-12 NOTE — Patient Instructions (Signed)
Reasons why it is important to allow yourself to process and experience true feelings: When you numb sadness, you also numb happiness and Kelli Tate. Struggling with your emotions often leads to more suffering. Processing and experiencing your feelings is a part of having a full life.    Coping skills are things we can do to make ourselves feel better when we are going through difficult times. Examples of coping skills include:  Take a deep breath Count to 20 Listen to music Call a friend Take a walk Read a book Do a puzzle Meditate Journal Exercise Stretch Sing Bake Knit Go outside Garden Pray Color Send a note Take a bath Watch a movie Pet an animal Visit a friend Be alone in a quiet place   When your anxiety gets worse, try these grounding techniques:      If you need additional help, please contact your primary care provider or call one of the resources listed below:  [facility]  24-hour HelpLine at 336-832-9700 or 800-711-2635 [address] [place], [place] [address]  National Hopeline Network 800-SUICIDE or 800-784-2433  The National Suicide Prevention Lifeline 800-273-TALK or 800-273-8255  [facility] https://www.celebraterecovery.com/   

## 2021-03-12 NOTE — Progress Notes (Signed)
    Procedures performed today:    None.  Independent interpretation of notes and tests performed by another provider:   None.  Brief History, Exam, Impression, and Recommendations:    Left hip pain This is a very pleasant 50 year old female, she had an episode where she had some difficulty lifting up her thigh, moderate pain, she felt a pop and was able to move it again, she did show the C-spine at the last visit when describing her discomfort, she did also have a positive FADIR sign concerning for hip labral tear, she has improved considerably over the last 6 weeks. Ibuprofen is not cutting it so we will add some tramadol, continue home conditioning exercises and we will hold off on aggressive interventions for now, though if she does not have sufficient improvement over the next 6 to 8 weeks we will proceed with MR arthrography.    ___________________________________________ Ihor Austin. Benjamin Stain, M.D., ABFM., CAQSM. Primary Care and Sports Medicine  [facility]  Adjunct Instructor of Family Medicine  [employer]

## 2021-03-12 NOTE — Assessment & Plan Note (Signed)
This is a very pleasant 50 year old female, she had an episode where she had some difficulty lifting up her thigh, moderate pain, she felt a pop and was able to move it again, she did show the C-spine at the last visit when describing her discomfort, she did also have a positive FADIR sign concerning for hip labral tear, she has improved considerably over the last 6 weeks. Ibuprofen is not cutting it so we will add some tramadol, continue home conditioning exercises and we will hold off on aggressive interventions for now, though if she does not have sufficient improvement over the next 6 to 8 weeks we will proceed with MR arthrography.

## 2021-03-12 NOTE — Progress Notes (Signed)
  HPI with pertinent ROS:   CC: 43-month follow-up  HPI: Pleasant 50 year old female presenting today for follow-up. She has been doing fairly well overall since our last visit.  She is using the Wellbutrin as prescribed to help with smoking cessation but has not been able to quit just yet.  She was previously almost up to 2 packs/day but has been able to reduce this to around 1 pack/day or less.  Has noted that the Wellbutrin helps with stress and mood a little but she has been dealing with several issues lately that have worsened her stress level.  Has not taken any other medications for mood but her significant other is taking Lexapro.  She wonders if this might be helpful.  She is not currently doing any counseling but she does have that option through work and is considering reaching out to get started with that.  Denies SI/HI.  I reviewed the past medical history, family history, social history, surgical history, and allergies today and no changes were needed.  Please see the problem list section below in epic for further details.   Physical exam:   General: Well Developed, well nourished, and in no acute distress.  Neuro: Alert and oriented x3.  HEENT: Normocephalic, atraumatic.  Skin: Warm and dry. Cardiac: Regular rate and rhythm, no murmurs rubs or gallops, no lower extremity edema.  Respiratory: Clear to auscultation bilaterally. Not using accessory muscles, speaking in full sentences.  Impression and Recommendations:    1. Tobacco abuse Continue Wellbutrin 150 mg twice daily.  We will hold off on increasing this at this point since she does have quite a bit of stress and anxiety right now.  Recommend continuing smoking cessation efforts. - buPROPion (WELLBUTRIN SR) 150 MG 12 hr tablet; Take 1 tablet (150 mg total) by mouth 2 (two) times daily.  Dispense: 180 tablet; Refill: 3  2. Stress and adjustment reaction Discussed various options for treatment.  Strongly recommend starting  counseling for both her and her son since he has a big part of her stress.  Starting Lexapro 5 mg daily.  3. Need for influenza vaccination Declined flu vaccine  4. Need for shingles vaccine Shingrix No. 1 given in office today.  Next will be due in 2 to 6 months. - Varicella-zoster vaccine IM (Shingrix)  5. Encounter for screening mammogram for malignant neoplasm of breast Mammogram ordered. - MM 3D SCREEN BREAST BILATERAL; Future  Return in about 4 weeks (around 04/09/2021) for mood follow up (ok to be virtual if needed). ___________________________________________ Thayer Ohm, DNP, APRN, FNP-BC Primary Care and Sports Medicine [facility] [place]

## 2021-04-10 ENCOUNTER — Telehealth (INDEPENDENT_AMBULATORY_CARE_PROVIDER_SITE_OTHER): Payer: BC Managed Care – PPO | Admitting: Medical-Surgical

## 2021-04-10 ENCOUNTER — Encounter: Payer: Self-pay | Admitting: Medical-Surgical

## 2021-04-10 VITALS — Ht 65.0 in | Wt 170.0 lb

## 2021-04-10 DIAGNOSIS — F4329 Adjustment disorder with other symptoms: Secondary | ICD-10-CM

## 2021-04-10 NOTE — Progress Notes (Signed)
Virtual Visit via Video Note  I connected with Kelli Tate on 04/10/21 at  9:30 AM EST by a video enabled telemedicine application and verified that I am speaking with the correct person using two identifiers.   I discussed the limitations of evaluation and management by telemedicine and the availability of in person appointments. The patient expressed understanding and agreed to proceed.  Patient location: home Provider locations: office  Subjective:    CC: Mood follow-up  HPI: Pleasant 50 year old female presenting today via MyChart video visit for mood follow-up.  Approximately 4 weeks ago, we started on Lexapro 5 mg daily in conjunction with Wellbutrin 150 mg twice daily.  She has been taking the new medication, tolerating well without side effects.  Does not feel that she has noticed any specific change in her anxiety and depression symptoms since she started the Lexapro.  Denies SI/HI.  Past medical history, Surgical history, Family history not pertinant except as noted below, Social history, Allergies, and medications have been entered into the medical record, reviewed, and corrections made.   Review of Systems: See HPI for pertinent positives and negatives.   Depression screen [facility] 2/9 04/10/2021 03/12/2021 09/10/2020 05/25/2019  Decreased Interest 0 1 0 0  Down, Depressed, Hopeless 1 1 0 0  PHQ - 2 Score 1 2 0 0  Altered sleeping 0 1 1 1   Tired, decreased energy 1 1 1 1   Change in appetite 0 0 0 0  Feeling bad or failure about yourself  0 1 0 0  Trouble concentrating 0 0 0 0  Moving slowly or fidgety/restless 0 0 0 0  Suicidal thoughts 0 0 0 0  PHQ-9 Score 2 5 2 2   Difficult doing work/chores Not difficult at all Not difficult at all Not difficult at all Not difficult at all   GAD 7 : Generalized Anxiety Score 03/12/2021 09/10/2020 05/25/2019  Nervous, Anxious, on Edge 1 0 1  Control/stop worrying 0 0 1  Worry too much - different things 1 1 1   Trouble relaxing 1 0 1  Restless  0 0 0  Easily annoyed or irritable 1 0 0  Afraid - awful might happen 0 0 0  Total GAD 7 Score 4 1 4   Anxiety Difficulty Not difficult at all Not difficult at all Not difficult at all    Objective:    General: Speaking clearly in complete sentences without any shortness of breath.  Alert and oriented x3.  Normal judgment. No apparent acute distress.  Impression and Recommendations:    1. Stress and adjustment reaction Continue Wellbutrin 150 mg twice daily.  Discussed the expectations for Lexapro.  She should not feel rush or any noticeable effects from taking Lexapro but it should be a slow steady improvement in her overall mood and stress.  Looking at her PHQ and gad numbers, they are still very low with an improvement in her PHQ-9 scores from 5 to 2 with slight worsening of her anxiety scores from 1 to 4.  Symptoms are fairly well managed overall.  Discussed the risks versus benefits of side effects when increasing the medication.  After discussion, patient would like to continue with her current dose of 5 mg daily along with Wellbutrin to see how she does over the next few weeks.  She will let me know if this is not effective and we will reevaluate at that time.  I discussed the assessment and treatment plan with the patient. The patient was provided an opportunity  to ask questions and all were answered. The patient agreed with the plan and demonstrated an understanding of the instructions.   The patient was advised to call back or seek an in-person evaluation if the symptoms worsen or if the condition fails to improve as anticipated.  25 minutes of non-face-to-face time was provided during this encounter.  Return in about 3 months (around 07/09/2021) for mood follow up.  Thayer Ohm, DNP, APRN, FNP-BC [facility] [place] [facility] and Sports Medicine

## 2021-04-22 ENCOUNTER — Other Ambulatory Visit: Payer: Self-pay

## 2021-04-22 ENCOUNTER — Ambulatory Visit (INDEPENDENT_AMBULATORY_CARE_PROVIDER_SITE_OTHER): Payer: BC Managed Care – PPO

## 2021-04-22 DIAGNOSIS — Z1231 Encounter for screening mammogram for malignant neoplasm of breast: Secondary | ICD-10-CM

## 2021-04-24 ENCOUNTER — Other Ambulatory Visit: Payer: Self-pay | Admitting: Medical-Surgical

## 2021-04-24 DIAGNOSIS — R928 Other abnormal and inconclusive findings on diagnostic imaging of breast: Secondary | ICD-10-CM

## 2021-05-13 ENCOUNTER — Ambulatory Visit: Payer: BC Managed Care – PPO

## 2021-05-13 ENCOUNTER — Ambulatory Visit
Admission: RE | Admit: 2021-05-13 | Discharge: 2021-05-13 | Disposition: A | Discharge status: Home / Self Care | Payer: BC Managed Care – PPO | Source: Ambulatory Visit | Attending: Medical-Surgical | Admitting: Medical-Surgical

## 2021-05-13 DIAGNOSIS — R928 Other abnormal and inconclusive findings on diagnostic imaging of breast: Secondary | ICD-10-CM

## 2021-08-03 ENCOUNTER — Other Ambulatory Visit: Payer: Self-pay | Admitting: Medical-Surgical

## 2021-08-28 ENCOUNTER — Other Ambulatory Visit: Payer: Self-pay | Admitting: Medical-Surgical

## 2021-09-23 NOTE — Progress Notes (Signed)
Virtual Visit via Video Note  I connected with Kelli Tate on 09/24/21 at  9:10 AM EDT by a video enabled telemedicine application and verified that I am speaking with the correct person using two identifiers.   I discussed the limitations of evaluation and management by telemedicine and the availability of in person appointments. The patient expressed understanding and agreed to proceed.  Patient location: home Provider locations: office  Subjective:    CC: Mood follow-up  HPI: Pleasant 51 year old female presenting today for follow-up on mood.  She has been taking Lexapro 5 mg daily and Wellbutrin 150 mg twice daily, tolerating well without side effects.  Unfortunately, she has had episodes where she ran out of the medication several times over the past 6 months.  At this point, she has been off of Lexapro for 2 weeks but has continued to take the Wellbutrin 150 mg daily.  Since being off the Lexapro, she notes that she cries most of the day every day and has been overly emotional to most situations.  She is interested in getting back on the Lexapro but thinks she may need a bit higher dose than before.  She is also interested in a referral to counseling now that she has insurance in place.  Denies SI/HI.  Past medical history, Surgical history, Family history not pertinant except as noted below, Social history, Allergies, and medications have been entered into the medical record, reviewed, and corrections made.   Review of Systems: See HPI for pertinent positives and negatives.   Objective:    General: Speaking clearly in complete sentences without any shortness of breath.  Alert and oriented x3.  Normal judgment. No apparent acute distress.  Impression and Recommendations:    1. Stress and adjustment reaction Restart Lexapro but increase the dose to 10 mg daily.  Continue Wellbutrin 150 mg daily.  Referring to behavioral health for counseling. - Ambulatory referral to [facility]  I discussed the assessment and treatment plan with the patient. The patient was provided an opportunity to ask questions and all were answered. The patient agreed with the plan and demonstrated an understanding of the instructions.   The patient was advised to call back or seek an in-person evaluation if the symptoms worsen or if the condition fails to improve as anticipated.  25 minutes of non-face-to-face time was provided during this encounter.  Return in about 6 weeks (around 11/05/2021) for mood follow up.  Clearnce Sorrel, DNP, APRN, FNP-BC [facility]

## 2021-09-24 ENCOUNTER — Telehealth: Payer: Medicaid Other | Admitting: Medical-Surgical

## 2021-09-24 ENCOUNTER — Encounter: Payer: Self-pay | Admitting: Medical-Surgical

## 2021-09-24 DIAGNOSIS — F4329 Adjustment disorder with other symptoms: Secondary | ICD-10-CM | POA: Diagnosis not present

## 2021-09-24 MED ORDER — ESCITALOPRAM OXALATE 10 MG PO TABS
10.0000 mg | ORAL_TABLET | Freq: Every day | ORAL | 3 refills | Status: DC
Start: 2021-09-24 — End: 2021-12-24

## 2021-10-05 ENCOUNTER — Telehealth: Payer: Self-pay

## 2021-10-05 NOTE — Telephone Encounter (Signed)
 Have her go to the [employer] and let me know where to send it, because it will be a lot cheaper, maybe $30 without insurance and with the coupon.

## 2021-10-07 NOTE — Telephone Encounter (Signed)
LVM requesting pt callback for information.

## 2021-11-04 DIAGNOSIS — H5213 Myopia, bilateral: Secondary | ICD-10-CM | POA: Diagnosis not present

## 2021-12-04 DIAGNOSIS — H52203 Unspecified astigmatism, bilateral: Secondary | ICD-10-CM | POA: Diagnosis not present

## 2021-12-04 DIAGNOSIS — H5213 Myopia, bilateral: Secondary | ICD-10-CM | POA: Diagnosis not present

## 2021-12-24 ENCOUNTER — Ambulatory Visit: Payer: Medicaid Other | Admitting: Medical-Surgical

## 2021-12-24 ENCOUNTER — Encounter: Payer: Self-pay | Admitting: Medical-Surgical

## 2021-12-24 VITALS — BP 127/84 | HR 80 | Resp 20 | Ht 65.0 in | Wt 195.6 lb

## 2021-12-24 DIAGNOSIS — Z23 Encounter for immunization: Secondary | ICD-10-CM

## 2021-12-24 DIAGNOSIS — N926 Irregular menstruation, unspecified: Secondary | ICD-10-CM

## 2021-12-24 DIAGNOSIS — Z1211 Encounter for screening for malignant neoplasm of colon: Secondary | ICD-10-CM | POA: Diagnosis not present

## 2021-12-24 DIAGNOSIS — Z0000 Encounter for general adult medical examination without abnormal findings: Secondary | ICD-10-CM | POA: Diagnosis not present

## 2021-12-24 DIAGNOSIS — F4329 Adjustment disorder with other symptoms: Secondary | ICD-10-CM | POA: Diagnosis not present

## 2021-12-24 MED ORDER — ESCITALOPRAM OXALATE 20 MG PO TABS: 20.0000 mg | ORAL_TABLET | Freq: Every day | ORAL | 1 refills | Status: DC

## 2021-12-24 NOTE — Progress Notes (Signed)
Established Patient Office Visit  Subjective   Patient ID: Kelli Tate, female   DOB: 1970/12/29 Age: 51 y.o. MRN: [id number]   Chief Complaint  Patient presents with   Hormone issues    HPI Pleasant 51 year old female presenting today with reports of emotional instability.  She has been taking Lexapro 10 mg daily as well as Wellbutrin 150 mg twice daily as prescribed, tolerating well without side effects.  Notes that the medication is helping however she has had some significant life stressors over the last 6 months that are worsening her overall mood. Notes that she lost her job and has been applying to everything she can but has not been able to secure a position. Feels like everything is falling apart. Has started donating plasma to boost income. Was talking with her family member who mentioned that she should have her hormones checked since menopausal changes can cause emotional concerns. Still having menstrual cycles although they have become irregular and unpredictable. Her mother and grandmother went through menopause early in life and continued to have symptoms for many years.    Objective:    Vitals:   12/24/21 0912  BP: 127/84  Pulse: 80  Resp: 20  Height: 5\' 5"  (1.651 m)  Weight: 195 lb 9.6 oz (88.7 kg)  SpO2: 99%  BMI (Calculated): 32.55    Physical Exam Vitals and nursing note reviewed.  Constitutional:      General: She is not in acute distress.    Appearance: Normal appearance. She is not ill-appearing.  HENT:     Head: Normocephalic and atraumatic.  Cardiovascular:     Rate and Rhythm: Normal rate and regular rhythm.     Pulses: Normal pulses.     Heart sounds: Normal heart sounds.  Pulmonary:     Effort: Pulmonary effort is normal. No respiratory distress.     Breath sounds: Normal breath sounds. No wheezing, rhonchi or rales.  Skin:    General: Skin is warm and dry.  Neurological:     Mental Status: She is alert and oriented to person, place, and time.   Psychiatric:        Mood and Affect: Mood normal.        Behavior: Behavior normal.        Thought Content: Thought content normal.        Judgment: Judgment normal.   No results found for this or any previous visit (from the past 24 hour(s)).     The 10-year ASCVD risk score (Arnett DK, et al., 2019) is: 3.3%   Values used to calculate the score:     Age: 29 years     Sex: Female     Is Non-Hispanic African American: No     Diabetic: No     Tobacco smoker: Yes     Systolic Blood Pressure: 127 mmHg     Is BP treated: No     HDL Cholesterol: 59 mg/dL     Total Cholesterol: 207 mg/dL   Assessment & Plan:   1. Colon cancer screening Discussed recommendations for colon cancer screening.  She has never had a colonoscopy or completing any form of screening.  Reviewed options available.  She decided to go with Cologuard ordered today. - Cologuard  2. Need for shingles vaccine Shingrix No. 2 given in office today. - Zoster Recombinant (Shingrix )  3. Irregular menses Discussed menopausal changes and testing that is available for hormones.  She would like to proceed with checking  her hormones to see if there is anything that is out of the normal range.  Reviewed that menopause is diagnosed after 12 months of no vaginal bleeding although there may be symptoms of perimenopause for years surrounding this time. - Estradiol - Testosterone - 17-Hydroxyprogesterone - FSH/LH - TSH  4. Preventative health care Checking labs as below. - CBC with Differential/Platelet - COMPLETE METABOLIC PANEL WITH GFR - Lipid panel  5. Stress and adjustment reaction Increasing Lexapro to 20 mg daily.  Continue Wellbutrin 150 mg twice daily.  Suspect that her emotional status is less related to menopausal changes and more related to her life situations.   Return in about 6 weeks (around 02/04/2022) for mood follow up.  ___________________________________________ Thayer Ohm, DNP, APRN,  FNP-BC Primary Care and Sports Medicine [facility] [place]

## 2021-12-30 LAB — CBC WITH DIFFERENTIAL/PLATELET
Absolute Monocytes: 491 cells/uL (ref 200–950)
Basophils Absolute: 32 cells/uL (ref 0–200)
Basophils Relative: 0.7 %
Eosinophils Absolute: 279 cells/uL (ref 15–500)
Eosinophils Relative: 6.2 %
HCT: 41.4 % (ref 35.0–45.0)
Hemoglobin: 14 g/dL (ref 11.7–15.5)
Lymphs Abs: 1229 cells/uL (ref 850–3900)
MCH: 30.3 pg (ref 27.0–33.0)
MCHC: 33.8 g/dL (ref 32.0–36.0)
MCV: 89.6 fL (ref 80.0–100.0)
MPV: 10.1 fL (ref 7.5–12.5)
Monocytes Relative: 10.9 %
Neutro Abs: 2471 cells/uL (ref 1500–7800)
Neutrophils Relative %: 54.9 %
Platelets: 227 10*3/uL (ref 140–400)
RBC: 4.62 10*6/uL (ref 3.80–5.10)
RDW: 14.7 % (ref 11.0–15.0)
Total Lymphocyte: 27.3 %
WBC: 4.5 10*3/uL (ref 3.8–10.8)

## 2021-12-30 LAB — 17-HYDROXYPROGESTERONE: 17-OH-Progesterone, LC/MS/MS: 19 ng/dL

## 2021-12-30 LAB — COMPLETE METABOLIC PANEL WITH GFR
AG Ratio: 2.2 (calc) (ref 1.0–2.5)
ALT: 11 U/L (ref 6–29)
AST: 18 U/L (ref 10–35)
Albumin: 4.2 g/dL (ref 3.6–5.1)
Alkaline phosphatase (APISO): 56 U/L (ref 37–153)
BUN/Creatinine Ratio: 7 (calc) (ref 6–22)
BUN: 6 mg/dL — ABNORMAL LOW (ref 7–25)
CO2: 28 mmol/L (ref 20–32)
Calcium: 9.5 mg/dL (ref 8.6–10.4)
Chloride: 104 mmol/L (ref 98–110)
Creat: 0.92 mg/dL (ref 0.50–1.03)
Globulin: 1.9 g/dL (calc) (ref 1.9–3.7)
Glucose, Bld: 97 mg/dL (ref 65–99)
Potassium: 4.6 mmol/L (ref 3.5–5.3)
Sodium: 139 mmol/L (ref 135–146)
Total Bilirubin: 0.2 mg/dL (ref 0.2–1.2)
Total Protein: 6.1 g/dL (ref 6.1–8.1)
eGFR: 76 mL/min/{1.73_m2} (ref >=60)

## 2021-12-30 LAB — LIPID PANEL
Cholesterol: 240 mg/dL — ABNORMAL HIGH (ref <200)
HDL: 60 mg/dL (ref >=50)
LDL Cholesterol (Calc): 144 mg/dL (calc) — ABNORMAL HIGH
Non-HDL Cholesterol (Calc): 180 mg/dL (calc) — ABNORMAL HIGH (ref <130)
Total CHOL/HDL Ratio: 4 (calc) (ref <5.0)
Triglycerides: 224 mg/dL — ABNORMAL HIGH (ref <150)

## 2021-12-30 LAB — TESTOSTERONE, TOTAL, LC/MS/MS: Testosterone, Total, LC-MS-MS: 18 ng/dL (ref 2–45)

## 2021-12-30 LAB — TSH: TSH: 1.54 mIU/L

## 2021-12-30 LAB — ESTRADIOL: Estradiol: 69 pg/mL

## 2021-12-30 LAB — FSH/LH
FSH: 22.1 m[IU]/mL
LH: 11.1 m[IU]/mL

## 2022-01-08 DIAGNOSIS — Z1211 Encounter for screening for malignant neoplasm of colon: Secondary | ICD-10-CM | POA: Diagnosis not present

## 2022-01-14 LAB — COLOGUARD: COLOGUARD: NEGATIVE

## 2022-02-01 NOTE — Progress Notes (Signed)
Complete physical exam  Patient: Kelli Tate   DOB: [birth year]   51 y.o. Female  MRN: [id number]  Subjective:    Chief Complaint  Patient presents with   Annual Exam    Kelli Tate is a 51 y.o. female who presents today for a complete physical exam. She reports consuming a general diet. The patient does not participate in regular exercise at present. She generally feels well. She reports sleeping well. She does not have additional problems to discuss today.    Most recent fall risk assessment:    12/24/2021    9:56 AM  Fall Risk   Falls in the past year? 0  Number falls in past yr: 0  Injury with Fall? 0  Risk for fall due to : No Fall Risks  Follow up Falls evaluation completed     Most recent depression screenings:    12/24/2021    9:56 AM 04/10/2021    9:41 AM  PHQ 2/9 Scores  PHQ - 2 Score 6 1  PHQ- 9 Score 20 2    Vision:Within last year and Dental: No current dental problems and No regular dental care     Patient Care Team: Samuel Bouche, NP as PCP - General (Nurse Practitioner)   Outpatient Medications Prior to Visit  Medication Sig   acetaminophen (TYLENOL) 500 MG tablet Take 500 mg by mouth every 6 (six) hours as needed.   buPROPion (WELLBUTRIN SR) 150 MG 12 hr tablet Take 1 tablet (150 mg total) by mouth 2 (two) times daily.   cetirizine (ZYRTEC) 10 MG tablet Take 10 mg by mouth daily.   escitalopram (LEXAPRO) 20 MG tablet Take 1 tablet (20 mg total) by mouth daily.   MULTIPLE VITAMIN PO Take 1 tablet by mouth daily.   tizanidine (ZANAFLEX) 6 MG capsule Take 1 capsule (6 mg total) by mouth 2 (two) times daily as needed for muscle spasms.   No facility-administered medications prior to visit.    Review of Systems  Constitutional:  Negative for chills, fever, malaise/fatigue and weight loss.  HENT:  Negative for congestion, ear pain, hearing loss, sinus pain and sore throat.   Eyes:  Negative for blurred vision, photophobia and pain.  Respiratory:   Negative for cough, shortness of breath and wheezing.   Cardiovascular:  Negative for chest pain, palpitations and leg swelling.  Gastrointestinal:  Negative for abdominal pain, constipation, diarrhea, heartburn, nausea and vomiting.  Genitourinary:  Negative for dysuria, frequency and urgency.  Musculoskeletal:  Negative for falls and neck pain.  Skin:  Negative for itching and rash.  Neurological:  Negative for dizziness, weakness and headaches.  Endo/Heme/Allergies:  Negative for polydipsia. Does not bruise/bleed easily.  Psychiatric/Behavioral:  Negative for depression, substance abuse and suicidal ideas. The patient is not nervous/anxious and does not have insomnia.      Objective:    BP 135/75   Pulse 70   Ht 5\' 5"  (1.651 m)   Wt 196 lb (88.9 kg)   SpO2 99%   BMI 32.62 kg/m    Physical Exam Constitutional:      General: She is not in acute distress.    Appearance: Normal appearance. She is not ill-appearing.  HENT:     Head: Normocephalic and atraumatic.     Right Ear: Tympanic membrane, ear canal and external ear normal. There is no impacted cerumen.     Left Ear: Tympanic membrane, ear canal and external ear normal. There is no impacted cerumen.  Nose: Nose normal. No congestion or rhinorrhea.     Mouth/Throat:     Mouth: Mucous membranes are moist.     Pharynx: No oropharyngeal exudate or posterior oropharyngeal erythema.  Eyes:     General: No scleral icterus.       Right eye: No discharge.        Left eye: No discharge.     Extraocular Movements: Extraocular movements intact.     Conjunctiva/sclera: Conjunctivae normal.     Pupils: Pupils are equal, round, and reactive to light.  Neck:     Thyroid: No thyromegaly.     Vascular: No carotid bruit or JVD.     Trachea: Trachea normal.  Cardiovascular:     Rate and Rhythm: Normal rate and regular rhythm.     Pulses: Normal pulses.     Heart sounds: Normal heart sounds. No murmur heard.    No friction rub. No  gallop.  Pulmonary:     Effort: Pulmonary effort is normal. No respiratory distress.     Breath sounds: Normal breath sounds. No wheezing.  Abdominal:     General: Bowel sounds are normal. There is no distension.     Palpations: Abdomen is soft.     Tenderness: There is no abdominal tenderness. There is no guarding.  Musculoskeletal:        General: Normal range of motion.     Cervical back: Normal range of motion and neck supple.  Lymphadenopathy:     Cervical: No cervical adenopathy.  Skin:    General: Skin is warm and dry.  Neurological:     Mental Status: She is alert and oriented to person, place, and time.     Cranial Nerves: No cranial nerve deficit.  Psychiatric:        Mood and Affect: Mood normal.        Behavior: Behavior normal.        Thought Content: Thought content normal.        Judgment: Judgment normal.   No results found for any visits on 02/02/22.     Assessment & Plan:    Routine Health Maintenance and Physical Exam  Immunization History  Administered Date(s) Administered   Moderna Sars-Covid-2 Vaccination 08/10/2019, 09/07/2019, 12/12/2019, 03/14/2020   Tdap 05/25/2019   Zoster Recombinat (Shingrix) 03/12/2021, 12/24/2021    Health Maintenance  Topic Date Due   COLONOSCOPY (Pts 45-25yrs Insurance coverage will need to be confirmed)  Never done   INFLUENZA VACCINE  07/11/2022 (Originally 11/10/2021)   PAP SMEAR-Modifier  06/29/2022   MAMMOGRAM  04/23/2023   TETANUS/TDAP  05/24/2029   HIV Screening  Completed   Zoster Vaccines- Shingrix  Completed   HPV VACCINES  Aged Out   COVID-19 Vaccine  Discontinued   Hepatitis C Screening  Discontinued    Discussed health benefits of physical activity, and encouraged her to engage in regular exercise appropriate for her age and condition.  1. Annual physical exam Labs drawn ahead of appointment and reviewed with patient.  Cholesterol is elevated so recommend a low fat heart healthy diet.  Up-to-date on  other preventative care.  Wellness information provided with AVS.  2. Stress incontinence Discussed various options including Kegel exercises at home.  These have not been successful so far so referring to pelvic physical therapy for further evaluation and management. - Ambulatory referral to Physical Therapy  Return in about 6 months (around 08/04/2022) for chronic disease follow up.   Christen Butter, NP

## 2022-02-02 ENCOUNTER — Ambulatory Visit (INDEPENDENT_AMBULATORY_CARE_PROVIDER_SITE_OTHER): Payer: Medicaid Other | Admitting: Medical-Surgical

## 2022-02-02 ENCOUNTER — Encounter: Payer: Self-pay | Admitting: Medical-Surgical

## 2022-02-02 VITALS — BP 135/75 | HR 70 | Ht 65.0 in | Wt 196.0 lb

## 2022-02-02 DIAGNOSIS — Z0000 Encounter for general adult medical examination without abnormal findings: Secondary | ICD-10-CM

## 2022-02-02 DIAGNOSIS — N393 Stress incontinence (female) (male): Secondary | ICD-10-CM | POA: Diagnosis not present

## 2022-03-29 ENCOUNTER — Other Ambulatory Visit: Payer: Self-pay | Admitting: Sports Medicine

## 2022-03-29 DIAGNOSIS — M48061 Spinal stenosis, lumbar region without neurogenic claudication: Secondary | ICD-10-CM

## 2022-05-06 ENCOUNTER — Ambulatory Visit: Payer: Medicaid Other | Admitting: Sports Medicine

## 2022-05-06 DIAGNOSIS — M48061 Spinal stenosis, lumbar region without neurogenic claudication: Secondary | ICD-10-CM

## 2022-05-06 MED ORDER — TIZANIDINE HCL 6 MG PO CAPS: 6.0000 mg | ORAL_CAPSULE | Freq: Two times a day (BID) | ORAL | 3 refills | Status: DC | PRN

## 2022-05-06 NOTE — Progress Notes (Signed)
    Procedures performed today:    None.  Independent interpretation of notes and tests performed by another provider:   None.  Brief History, Exam, Impression, and Recommendations:    Lumbar spinal stenosis Very pleasant 52 year old female, known lumbar spinal stenosis, she had a right L4-L5 interlaminar epidural back in the fall 2022 when she did really well until recently, she is having recurrence of pain, refilling muscle relaxer, reordering her epidural, she can return to see me as needed.  Chronic process with exacerbation and pharmacologic intervention  ____________________________________________ Gwen Her. Dianah Field, M.D., ABFM., CAQSM., AME. Primary Care and Sports Medicine [facility]  Adjunct Professor of [place] [employer] [facility] [employer]  Risk manager

## 2022-05-06 NOTE — Assessment & Plan Note (Signed)
Very pleasant 52 year old female, known lumbar spinal stenosis, she had a right L4-L5 interlaminar epidural back in the fall 2022 when she did really well until recently, she is having recurrence of pain, refilling muscle relaxer, reordering her epidural, she can return to see me as needed.

## 2022-05-21 ENCOUNTER — Ambulatory Visit
Admission: RE | Admit: 2022-05-21 | Discharge: 2022-05-21 | Disposition: A | Discharge status: Home / Self Care | Payer: Medicaid Other | Source: Ambulatory Visit | Attending: Sports Medicine | Admitting: Sports Medicine

## 2022-05-21 DIAGNOSIS — M48061 Spinal stenosis, lumbar region without neurogenic claudication: Secondary | ICD-10-CM

## 2022-05-21 DIAGNOSIS — M47817 Spondylosis without myelopathy or radiculopathy, lumbosacral region: Secondary | ICD-10-CM | POA: Diagnosis not present

## 2022-05-21 MED ORDER — METHYLPREDNISOLONE ACETATE 40 MG/ML INJ SUSP (RADIOLOG
80.0000 mg | Freq: Once | INTRAMUSCULAR | Status: AC
  Administered 2022-05-21: 80 mg via EPIDURAL

## 2022-05-21 MED ORDER — IOPAMIDOL (ISOVUE-M 200) INJECTION 41%
1.0000 mL | Freq: Once | INTRAMUSCULAR | Status: AC
  Administered 2022-05-21: 1 mL via EPIDURAL

## 2022-05-21 NOTE — Discharge Instructions (Signed)

## 2022-07-30 ENCOUNTER — Ambulatory Visit: Payer: Medicaid Other | Admitting: Sports Medicine

## 2022-07-30 DIAGNOSIS — M48061 Spinal stenosis, lumbar region without neurogenic claudication: Secondary | ICD-10-CM | POA: Diagnosis not present

## 2022-07-30 MED ORDER — GABAPENTIN 300 MG PO CAPS: ORAL_CAPSULE | ORAL | 3 refills | Status: DC

## 2022-07-30 NOTE — Progress Notes (Signed)
    Procedures performed today:    None.  Independent interpretation of notes and tests performed by another provider:   None.  Brief History, Exam, Impression, and Recommendations:    Lumbar spinal stenosis This is a very pleasant 52 year old female with known lumbar spinal stenosis, she has now had a couple of right L4-L5 interlaminar epidurals, she did well until recently, we ordered another epidural which unfortunately she did not get similar relief. She did try a friend's gabapentin which seem to work extremely well so we will call in 300 mg of gabapentin in an up taper, we discussed the medication, its uses, up titration and side effects. I would like her to touch base with me again either in person or in MyChart in 4 to 6 weeks to reevaluate. We can certainly do burst of prednisone along the way if she has flares in pain.  Chronic process with exacerbation and pharmacologic intervention  ____________________________________________ Ihor Austin. Benjamin Stain, M.D., ABFM., CAQSM., AME. Primary Care and Sports Medicine [facility]  Adjunct Professor of Family Medicine  [place] [employer] [facility] [employer]  Restaurant manager, fast food

## 2022-07-30 NOTE — Assessment & Plan Note (Signed)
This is a very pleasant 52 year old female with known lumbar spinal stenosis, she has now had a couple of right L4-L5 interlaminar epidurals, she did well until recently, we ordered another epidural which unfortunately she did not get similar relief. She did try a friend's gabapentin which seem to work extremely well so we will call in 300 mg of gabapentin in an up taper, we discussed the medication, its uses, up titration and side effects. I would like her to touch base with me again either in person or in MyChart in 4 to 6 weeks to reevaluate. We can certainly do burst of prednisone along the way if she has flares in pain.

## 2022-08-09 IMAGING — MG MM DIGITAL DIAGNOSTIC UNILAT*L* W/ TOMO W/ CAD
8 series · 9 of 24 positions shown · non-contrast
Comparison: Previous exam(s).

CLINICAL DATA: The patient was called back for possible distortion
in the lateral left breast.

EXAM:
DIGITAL DIAGNOSTIC UNILATERAL LEFT MAMMOGRAM WITH TOMOSYNTHESIS AND
CAD
TECHNIQUE: Left digital diagnostic mammography and breast tomosynthesis was
performed. The images were evaluated with computer-aided detection.

[L CC synth-2D (1 of 3)]
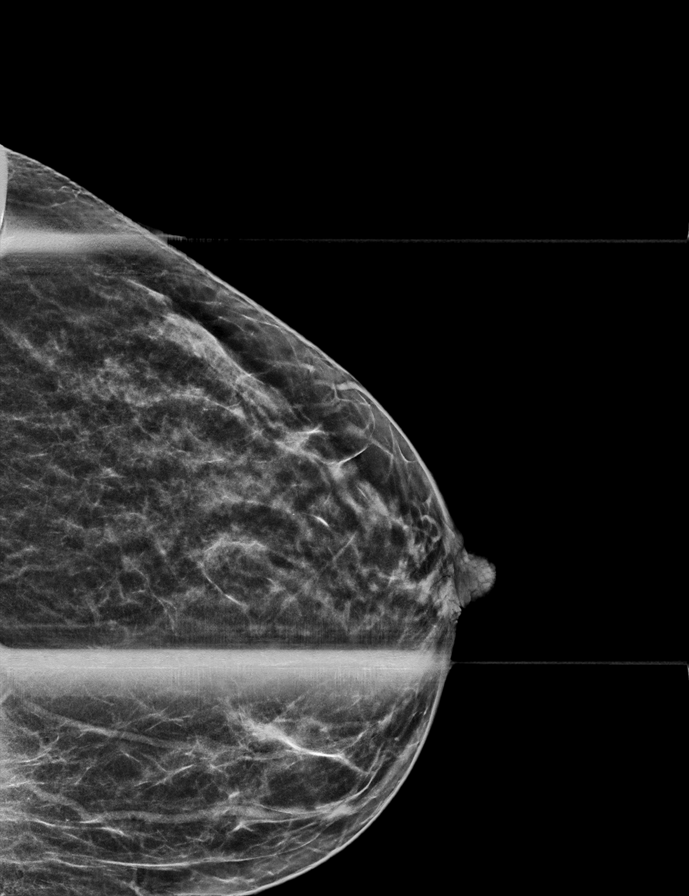

[L CC synth-2D (2 of 3)]
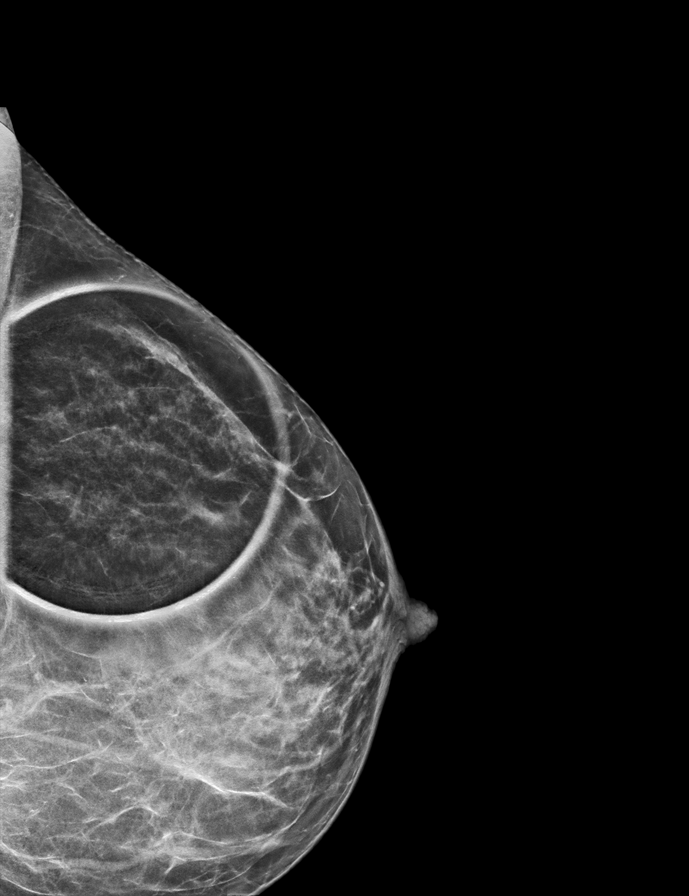

[L CC synth-2D (3 of 3)]
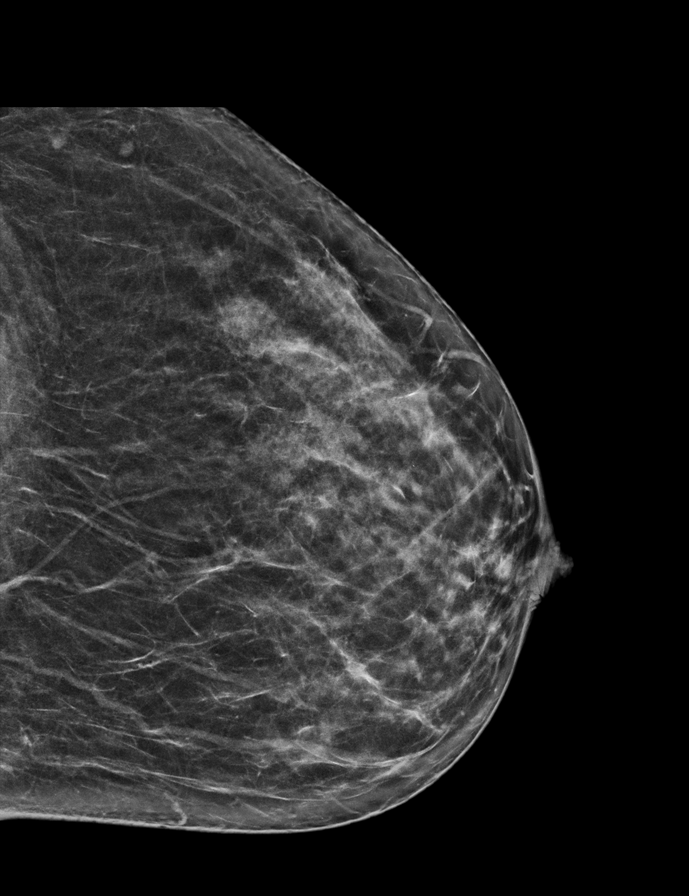

[L ML synth-2D]
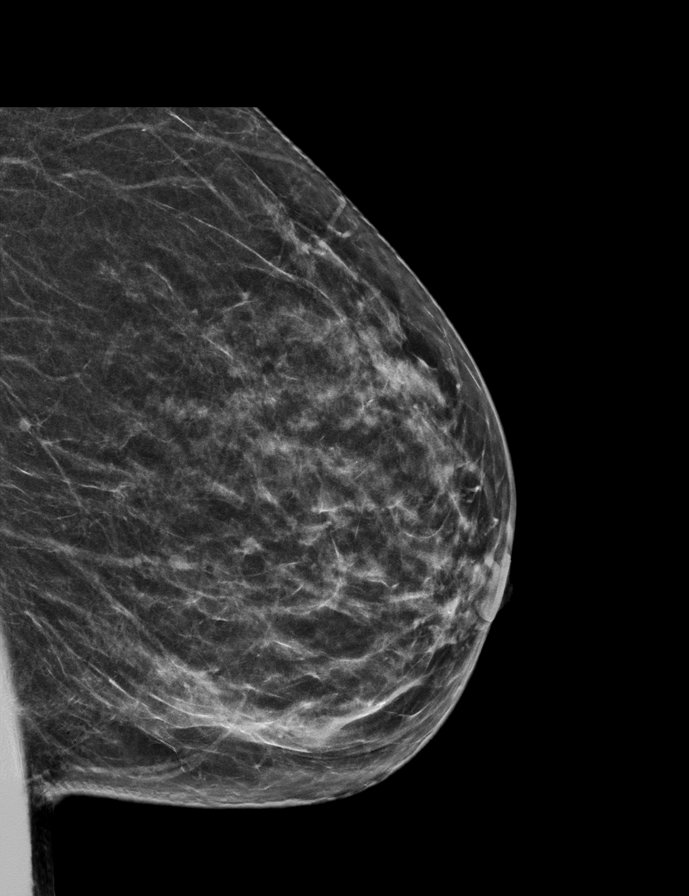

[L CC tomo · 2 of 49 frames shown (1 of 3)]
[frame 16/49]
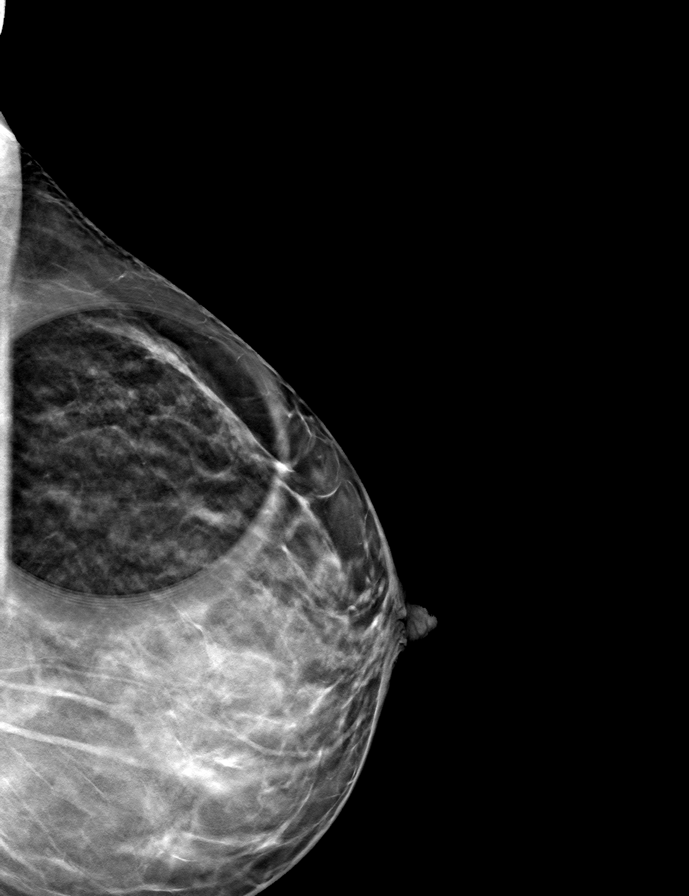
[frame 25/49]
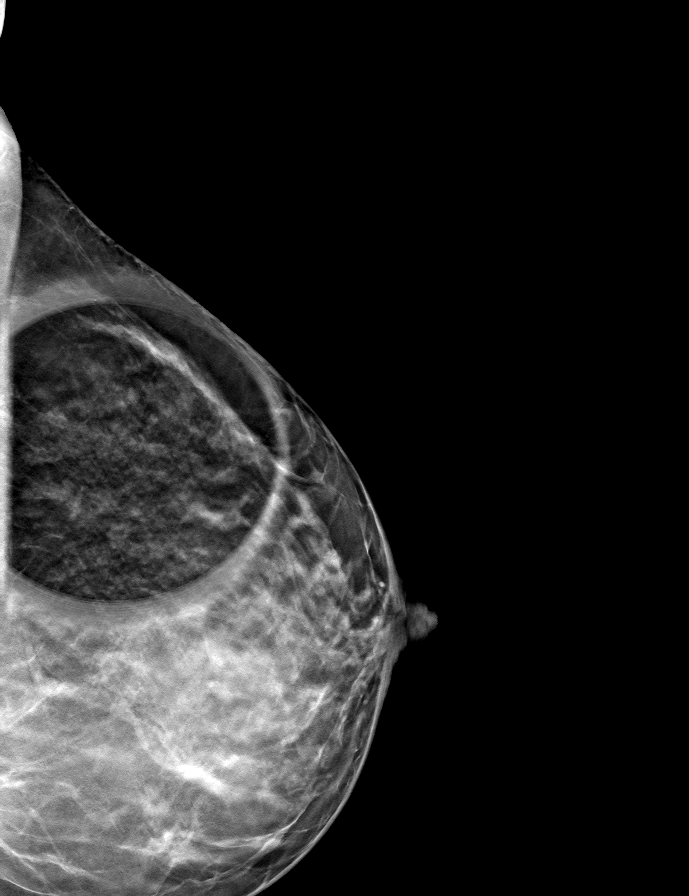

[L ML tomo · tomo slice 28/55.0]
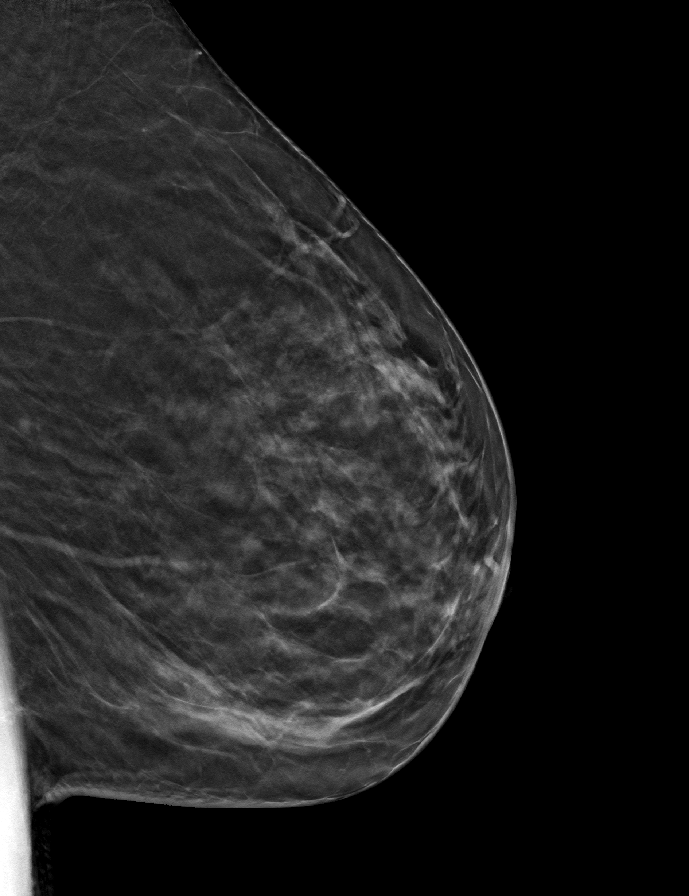

[L CC tomo (2 of 3) · tomo slice 25/50.0]
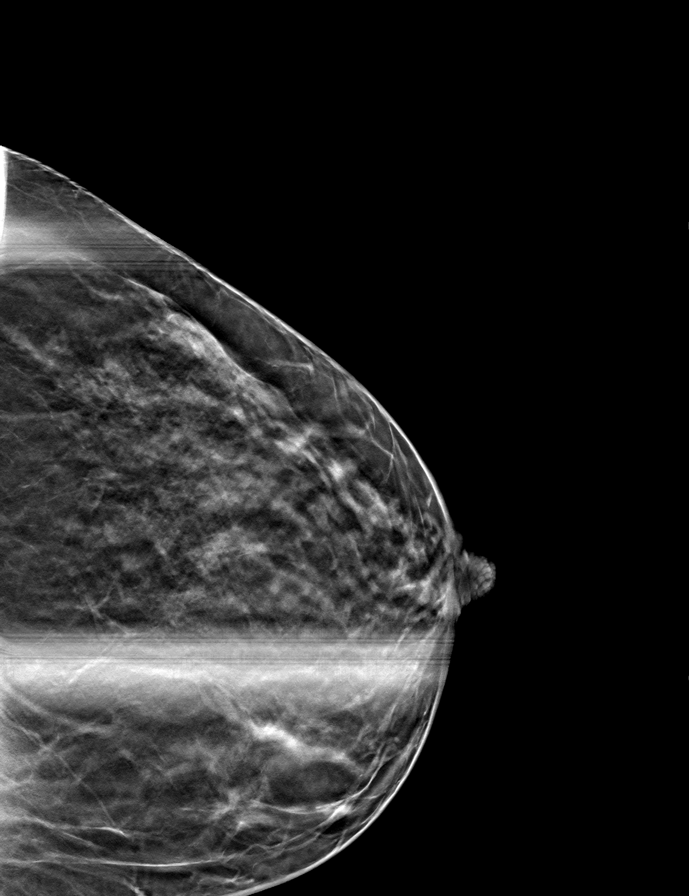

[L CC tomo (3 of 3) · tomo slice 29/56.0]
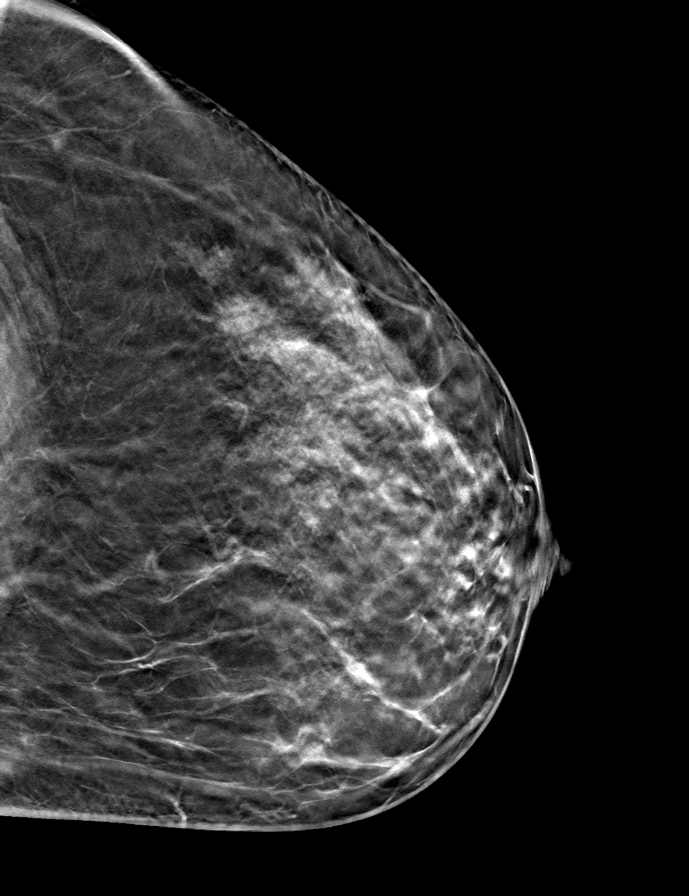

[9 of 24 positions shown; findings below may reference images not displayed]

ACR Breast Density Category c: The breast tissue is heterogeneously
dense, which may obscure small masses.
FINDINGS: The possible distortion in the lateral left breast resolves on
additional imaging.
IMPRESSION: No mammographic evidence of malignancy.

RECOMMENDATION:
Annual screening mammography.

I have discussed the findings and recommendations with the patient.
If applicable, a reminder letter will be sent to the patient
regarding the next appointment.

BI-RADS CATEGORY  2: Benign.

## 2023-01-03 ENCOUNTER — Encounter: Payer: Self-pay | Admitting: Medical-Surgical

## 2023-01-03 ENCOUNTER — Ambulatory Visit: Payer: Medicaid Other | Admitting: Medical-Surgical

## 2023-01-03 VITALS — BP 139/83 | HR 69 | Resp 20 | Ht 65.0 in | Wt 193.9 lb

## 2023-01-03 DIAGNOSIS — H60502 Unspecified acute noninfective otitis externa, left ear: Secondary | ICD-10-CM | POA: Diagnosis not present

## 2023-01-03 MED ORDER — NEOMYCIN-POLYMYXIN-HC 1 % OT SOLN: 3.0000 [drp] | Freq: Four times a day (QID) | OTIC | 0 refills | Status: AC

## 2023-01-03 NOTE — Progress Notes (Signed)
        Established patient visit  History, exam, impression, and plan:  1. Acute otitis externa of left ear, unspecified type Very pleasant 52 year old female presenting today with complaints of left ear discomfort.  A month ago, she and her partner were at the beach.  While she was there she developed some discomfort in the left ear which she attributed to swimmers ear.  She tried to use rubbing alcohol but this did not make it any better.  She got some eardrops at the pharmacy that were for swimmer's ear or ear infection and has been using these periodically.  Notes that she will see some improvement however after 2 to 3 days of stopping the drops, it comes back.  She is having significant pain in the left ear at night and it is interfering with her sleep.  She has also developed some pain in her left jaw when she is chewing.  On exam, the right ear canal and TM are normal.  The left ear canal is erythematous with mild swelling but no exudate.  Left TM is normal.  Symptoms consistent with otitis externa.  Adding Cortisporin 3 drops to the left ear 4 times daily for 1 week.  If symptoms persist/do not improve, return for further evaluation.  Procedures performed this visit: None.  Return if symptoms worsen or fail to improve.  __________________________________ Thayer Ohm, DNP, APRN, FNP-BC Primary Care and Sports Medicine [facility] [place]

## 2023-01-04 ENCOUNTER — Other Ambulatory Visit: Payer: Self-pay | Admitting: Sports Medicine

## 2023-01-04 DIAGNOSIS — M48061 Spinal stenosis, lumbar region without neurogenic claudication: Secondary | ICD-10-CM

## 2023-02-08 ENCOUNTER — Encounter (INDEPENDENT_AMBULATORY_CARE_PROVIDER_SITE_OTHER): Payer: Medicaid Other | Admitting: Sports Medicine

## 2023-02-08 DIAGNOSIS — M48061 Spinal stenosis, lumbar region without neurogenic claudication: Secondary | ICD-10-CM

## 2023-02-10 MED ORDER — PREDNISONE 50 MG PO TABS: ORAL_TABLET | ORAL | 0 refills | Status: DC

## 2023-02-10 NOTE — Telephone Encounter (Signed)

## 2023-02-14 MED ORDER — CYCLOBENZAPRINE HCL 10 MG PO TABS: ORAL_TABLET | ORAL | 0 refills | Status: DC

## 2023-02-14 NOTE — Addendum Note (Signed)
Addended by: Monica Becton on: 02/14/2023 01:28 PM   Modules accepted: Orders

## 2023-04-11 ENCOUNTER — Other Ambulatory Visit: Payer: Self-pay | Admitting: Sports Medicine

## 2023-04-11 DIAGNOSIS — M48061 Spinal stenosis, lumbar region without neurogenic claudication: Secondary | ICD-10-CM

## 2023-05-23 ENCOUNTER — Encounter (INDEPENDENT_AMBULATORY_CARE_PROVIDER_SITE_OTHER): Payer: Self-pay | Admitting: Sports Medicine

## 2023-05-23 ENCOUNTER — Other Ambulatory Visit: Payer: Self-pay | Admitting: Sports Medicine

## 2023-05-23 DIAGNOSIS — M48061 Spinal stenosis, lumbar region without neurogenic claudication: Secondary | ICD-10-CM

## 2023-06-14 ENCOUNTER — Other Ambulatory Visit: Payer: Self-pay | Admitting: Sports Medicine

## 2023-06-14 DIAGNOSIS — M48061 Spinal stenosis, lumbar region without neurogenic claudication: Secondary | ICD-10-CM

## 2023-06-15 MED ORDER — CYCLOBENZAPRINE HCL 10 MG PO TABS
5.0000 mg | ORAL_TABLET | Freq: Three times a day (TID) | ORAL | 0 refills | Status: DC | PRN
Start: 2023-06-15 — End: 2023-11-08

## 2023-06-15 NOTE — Telephone Encounter (Signed)

## 2023-08-17 ENCOUNTER — Encounter: Payer: Self-pay | Admitting: Medical-Surgical

## 2023-08-17 DIAGNOSIS — Z720 Tobacco use: Secondary | ICD-10-CM

## 2023-08-17 MED ORDER — BUPROPION HCL ER (SR) 150 MG PO TB12: 150.0000 mg | ORAL_TABLET | Freq: Two times a day (BID) | ORAL | 0 refills | Status: AC

## 2023-08-17 MED ORDER — ESCITALOPRAM OXALATE 20 MG PO TABS: 20.0000 mg | ORAL_TABLET | Freq: Every day | ORAL | 0 refills | Status: AC

## 2023-11-08 ENCOUNTER — Telehealth: Admitting: Physician Assistant

## 2023-11-08 DIAGNOSIS — M48061 Spinal stenosis, lumbar region without neurogenic claudication: Secondary | ICD-10-CM

## 2023-11-08 DIAGNOSIS — Z760 Encounter for issue of repeat prescription: Secondary | ICD-10-CM

## 2023-11-08 MED ORDER — GABAPENTIN 300 MG PO CAPS: 300.0000 mg | ORAL_CAPSULE | Freq: Two times a day (BID) | ORAL | 0 refills | Status: DC

## 2023-11-08 MED ORDER — PREDNISONE 20 MG PO TABS: 40.0000 mg | ORAL_TABLET | Freq: Every day | ORAL | 0 refills | Status: AC

## 2023-11-08 MED ORDER — CYCLOBENZAPRINE HCL 10 MG PO TABS: 5.0000 mg | ORAL_TABLET | Freq: Three times a day (TID) | ORAL | 0 refills | Status: AC | PRN

## 2023-11-08 NOTE — Patient Instructions (Signed)
  Kelli Tate, thank you for joining [name], PA-C for today's virtual visit.  While this provider is not your primary care provider (PCP), if your PCP is located in our provider database this encounter information will be shared with them immediately following your visit.   A [facility] MyChart account gives you access to today's visit and all your visits, tests, and labs performed at [facility]  click here if you don't have a [facility] MyChart account or go to [email or web address]  Consent: (Patient) Kelli Tate provided verbal consent for this virtual visit at the beginning of the encounter.  Current Medications:  Current Outpatient Medications:    acetaminophen (TYLENOL) 500 MG tablet, Take 500 mg by mouth every 6 (six) hours as needed., Disp: , Rfl:    buPROPion  (WELLBUTRIN  SR) 150 MG 12 hr tablet, Take 1 tablet (150 mg total) by mouth 2 (two) times daily., Disp: 60 tablet, Rfl: 0   cetirizine (ZYRTEC) 10 MG tablet, Take 10 mg by mouth daily., Disp: , Rfl:    cyclobenzaprine  (FLEXERIL ) 10 MG tablet, Take 0.5-1 tablets (5-10 mg total) by mouth 3 (three) times daily as needed for muscle spasms., Disp: 90 tablet, Rfl: 0   escitalopram  (LEXAPRO ) 20 MG tablet, Take 1 tablet (20 mg total) by mouth daily., Disp: 30 tablet, Rfl: 0   gabapentin  (NEURONTIN ) 300 MG capsule, TAKE 1 CAPSULE BY MOUTH EVERY NIGHT AT BEDTIME FOR 7 DAYS THEN TAKE 1 CAPSULE BY MOUTH 2 TIMES A DAY FOR 7 DAYS THEN TAKE 1 CAPSULE BY MOUTH 3 TIMES A DAY *MAY DOUBLE WEEKLY TO A MAX OF 3,600MG /DAY, Disp: 90 capsule, Rfl: 3   MULTIPLE VITAMIN PO, Take 1 tablet by mouth daily., Disp: , Rfl:    predniSONE  (DELTASONE ) 50 MG tablet, One tab PO daily for 5 days., Disp: 5 tablet, Rfl: 0   Medications ordered in this encounter:  No orders of the defined types were placed in this encounter.    *If you need refills on other medications prior to your next appointment, please contact your  pharmacy*  Follow-Up: Call back or seek an in-person evaluation if the symptoms worsen or if the condition fails to improve as anticipated.  [facility] [phone]  Other Instructions Avoid heavy lifting and overexertion.  Okay to restart your gabapentin , taking twice daily as directed. I have also refilled your Flexeril , as well as starting a short course of prednisone .  Take as directed. Okay to use Tylenol OTC. Make sure to contact your regular provider to schedule follow-up, as they will need to provide ongoing refills of your chronic medications. If you note any non-resolving, new, or worsening symptoms despite treatment, please seek an in-person evaluation ASAP.    If you have been instructed to have an in-person evaluation today at a local Urgent Care facility, please use the link below. It will take you to a list of all of our available [facility], including address, phone number and hours of operation. Please do not delay care.  [facility]  If you or a family member do not have a primary care provider, use the link below to schedule a visit and establish care. When you choose a [facility] primary care physician or advanced practice provider, you gain a long-term partner in health. Find a Primary Care Provider  Learn more about [facility]'s in-office and virtual care options: [facility] - Get Care Now

## 2023-11-08 NOTE — Progress Notes (Signed)
 Virtual Visit Consent   Kelli Tate, you are scheduled for a virtual visit with a [facility] provider today. Just as with appointments in the office, your consent must be obtained to participate. Your consent will be active for this visit and any virtual visit you may have with one of our providers in the next 365 days. If you have a MyChart account, a copy of this consent can be sent to you electronically.  As this is a virtual visit, video technology does not allow for your provider to perform a traditional examination. This may limit your provider's ability to fully assess your condition. If your provider identifies any concerns that need to be evaluated in person or the need to arrange testing (such as labs, EKG, etc.), we will make arrangements to do so. Although advances in technology are sophisticated, we cannot ensure that it will always work on either your end or our end. If the connection with a video visit is poor, the visit may have to be switched to a telephone visit. With either a video or telephone visit, we are not always able to ensure that we have a secure connection.  By engaging in this virtual visit, you consent to the provision of healthcare and authorize for your insurance to be billed (if applicable) for the services provided during this visit. Depending on your insurance coverage, you may receive a charge related to this service.  I need to obtain your verbal consent now. Are you willing to proceed with your visit today? Kelli SCHELLY CHUBA has provided verbal consent on 11/08/2023 for a virtual visit (video or telephone). Kelli Tate, [place]  Date: 11/08/2023 6:15 PM   Virtual Visit via Video Note   I, Kelli Tate, connected with  Kelli Tate  ([id number], 1971-03-19) on 11/08/23 at  7:00 PM EDT by a video-enabled telemedicine application and verified that I am speaking with the correct person using two identifiers.  Location: Patient: Virtual Visit Location  Patient: Home Provider: Virtual Visit Location Provider: Home Office   I discussed the limitations of evaluation and management by telemedicine and the availability of in person appointments. The patient expressed understanding and agreed to proceed.    History of Present Illness: MAZELLE HUEBERT is a 53 y.o. who identifies as a female who was assigned female at birth, and is being seen today for medication refill. 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 Notes a flare of her low back pain (hx of lumbar spinal stenosis) today when she stepped wrong and had a pop in her lower back. Notes pain is mostly left-sided and radiating into her lower extremity. Denies saddle anesthesia ro change to bowel or bladder habits. Some mild tingling of LLE which is common with her flares. Was on Gabaopentin 300 mg TID and Flexeril  as needed for her lumbar back pain but has been out for some time.    OTC -- Tylenol.  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    Allergies: No Known Allergies Medications:  Current Outpatient Medications:    predniSONE  (DELTASONE )  20 MG tablet, Take 2 tablets (40 mg total) by mouth daily with breakfast., Disp: 10 tablet, Rfl: 0   acetaminophen (TYLENOL) 500 MG tablet, Take 500 mg by mouth every 6 (six) hours as needed., Disp: , Rfl:    buPROPion  (WELLBUTRIN  SR) 150 MG 12 hr tablet, Take 1 tablet (150 mg total) by mouth 2 (two) times daily., Disp: 60 tablet, Rfl: 0   cetirizine (ZYRTEC) 10 MG tablet, Take 10 mg by mouth daily., Disp: , Rfl:    cyclobenzaprine  (FLEXERIL ) 10 MG tablet, Take 0.5-1 tablets (5-10 mg total) by  mouth 3 (three) times daily as needed for muscle spasms., Disp: 30 tablet, Rfl: 0   escitalopram  (LEXAPRO ) 20 MG tablet, Take 1 tablet (20 mg total) by mouth daily., Disp: 30 tablet, Rfl: 0   gabapentin  (NEURONTIN ) 300 MG capsule, Take 1 capsule (300 mg total) by mouth 2 (two) times daily., Disp: 60 capsule, Rfl: 0   MULTIPLE VITAMIN PO, Take 1 tablet by mouth daily., Disp: , Rfl:   Observations/Objective: Patient is well-developed, well-nourished in no acute distress.  Resting comfortably  at home.  Head is normocephalic, atraumatic.  No labored breathing.  Speech is clear and coherent with logical content.  Patient is alert and oriented at baseline.   Assessment and Plan: 1. Spinal stenosis of lumbar region without neurogenic claudication - cyclobenzaprine  (FLEXERIL ) 10 MG tablet; Take 0.5-1 tablets (5-10 mg total) by mouth 3 (three) times daily as needed for muscle spasms.  Dispense: 30 tablet; Refill: 0 - gabapentin  (NEURONTIN ) 300 MG capsule; Take 1 capsule (300 mg total) by mouth 2 (two) times daily.  Dispense: 60 capsule; Refill: 0 - predniSONE  (DELTASONE ) 20 MG tablet; Take 2 tablets (40 mg total) by mouth daily with breakfast.  Dispense: 10 tablet; Refill: 0  Flare of sciatica. Atraumatic. Supportive measures and OTC medications reviewed. Will give refill of her Gabapentin  to titrate back up, starting with 300 mg BID, as well as her Flexeril . Added on burst of prednisone . Needs in person follow-up with PCP or [facility] provider for ongoing management of chronic stenosis.   Follow Up Instructions: I discussed the assessment and treatment plan with the patient. The patient was provided an opportunity to ask questions and all were answered. The patient agreed with the plan and demonstrated an understanding of the instructions.  A copy of instructions were sent to the patient via MyChart unless otherwise noted below.   The patient was advised to call back or seek an in-person evaluation if the  symptoms worsen or if the condition fails to improve as anticipated.    Kelli Velma Lunger, PA-C

## 2023-11-08 NOTE — Progress Notes (Signed)
   Thank you for the details you included in the comment boxes. Those details are very helpful in determining the best course of treatment for you and help us  to provide the best care.Because we do not refill/restart medications via e-visit, we recommend that you schedule a Virtual Urgent Care video visit in order for the provider to better assess what is going on.  The provider will be able to give you a more accurate diagnosis and treatment plan if we can more freely discuss your symptoms and with the addition of a virtual examination.   If you change your visit to a video visit, we will bill your insurance (similar to an office visit) and you will not be charged for this e-Visit. You will be able to stay at home and speak with the first available [facility] advanced practice provider. The link to do a video visit is in the drop down Menu tab of your Welcome screen in MyChart.

## 2023-11-17 ENCOUNTER — Telehealth: Payer: Self-pay

## 2023-11-17 DIAGNOSIS — Z1231 Encounter for screening mammogram for malignant neoplasm of breast: Secondary | ICD-10-CM

## 2023-11-17 NOTE — Telephone Encounter (Signed)
 Patient agreed to a mammogram.  Order for mammogram placed.

## 2023-12-13 ENCOUNTER — Encounter: Payer: Self-pay | Admitting: Sports Medicine

## 2024-01-20 ENCOUNTER — Other Ambulatory Visit: Payer: Self-pay

## 2024-01-20 DIAGNOSIS — M48061 Spinal stenosis, lumbar region without neurogenic claudication: Secondary | ICD-10-CM

## 2024-01-20 MED ORDER — GABAPENTIN 300 MG PO CAPS: 300.0000 mg | ORAL_CAPSULE | Freq: Two times a day (BID) | ORAL | 0 refills | Status: AC
